# Patient Record
Sex: Male | Born: 1975
Health system: Southern US, Community
[De-identification: ages and names within clinical notes are randomized; demographics above are authoritative.]

## PROBLEM LIST (undated history)

## (undated) DIAGNOSIS — K219 Gastro-esophageal reflux disease without esophagitis: Secondary | ICD-10-CM

## (undated) DIAGNOSIS — G43909 Migraine, unspecified, not intractable, without status migrainosus: Secondary | ICD-10-CM

## (undated) DIAGNOSIS — U071 COVID-19: Secondary | ICD-10-CM

## (undated) DIAGNOSIS — F419 Anxiety disorder, unspecified: Secondary | ICD-10-CM

## (undated) HISTORY — PX: OTHER SURGICAL HISTORY: SHX169

## (undated) HISTORY — DX: Migraine, unspecified, not intractable, without status migrainosus: G43.909

## (undated) HISTORY — DX: Gastro-esophageal reflux disease without esophagitis: K21.9

## (undated) HISTORY — DX: COVID-19: U07.1

## (undated) HISTORY — PX: WISDOM TOOTH EXTRACTION: SHX21

## (undated) HISTORY — DX: Anxiety disorder, unspecified: F41.9

---

## 2006-04-08 HISTORY — PX: SIGMOIDOSCOPY: SUR1295

## 2006-06-07 LAB — HM COLONOSCOPY

## 2006-06-25 LAB — HM COLONOSCOPY

## 2016-08-13 ENCOUNTER — Ambulatory Visit (INDEPENDENT_AMBULATORY_CARE_PROVIDER_SITE_OTHER): Payer: 59 | Admitting: Primary Care

## 2016-08-13 ENCOUNTER — Encounter: Payer: Self-pay | Admitting: Primary Care

## 2016-08-13 VITALS — BP 98/78 | HR 82 | Temp 98.3°F | Ht 69.5 in | Wt 182.0 lb

## 2016-08-13 DIAGNOSIS — R1310 Dysphagia, unspecified: Secondary | ICD-10-CM

## 2016-08-13 DIAGNOSIS — G43701 Chronic migraine without aura, not intractable, with status migrainosus: Secondary | ICD-10-CM

## 2016-08-13 DIAGNOSIS — R0789 Other chest pain: Secondary | ICD-10-CM | POA: Diagnosis not present

## 2016-08-13 DIAGNOSIS — K219 Gastro-esophageal reflux disease without esophagitis: Secondary | ICD-10-CM | POA: Diagnosis not present

## 2016-08-13 LAB — COMPREHENSIVE METABOLIC PANEL
ALBUMIN: 4.3 g/dL (ref 3.5–5.2)
ALK PHOS: 77 U/L (ref 39–117)
ALT: 9 U/L (ref 0–53)
AST: 11 U/L (ref 0–37)
BUN: 19 mg/dL (ref 6–23)
CHLORIDE: 104 meq/L (ref 96–112)
CO2: 30 mEq/L (ref 19–32)
Calcium: 9.2 mg/dL (ref 8.4–10.5)
Creatinine, Ser: 1.17 mg/dL (ref 0.40–1.50)
GFR: 72.52 mL/min (ref >60.00)
GLUCOSE: 92 mg/dL (ref 70–99)
POTASSIUM: 4 meq/L (ref 3.5–5.1)
SODIUM: 139 meq/L (ref 135–145)
Total Bilirubin: 0.4 mg/dL (ref 0.2–1.2)
Total Protein: 7.2 g/dL (ref 6.0–8.3)

## 2016-08-13 LAB — TSH: TSH: 2.78 u[IU]/mL (ref 0.35–4.50)

## 2016-08-13 MED ORDER — SUMATRIPTAN SUCCINATE 50 MG PO TABS: ORAL_TABLET | ORAL | 0 refills | Status: DC

## 2016-08-13 MED ORDER — OMEPRAZOLE 40 MG PO CPDR: 40.0000 mg | DELAYED_RELEASE_CAPSULE | Freq: Every day | ORAL | 0 refills | Status: DC

## 2016-08-13 NOTE — Patient Instructions (Addendum)
The ECG of your heart looks perfect.  Start omeprazole 40 mg for acid reflux. Take 1 tablet by mouth once daily. Please notify me if no improvement within two weeks.  Complete lab work prior to leaving today. I will notify you of your results once received.   I sent refills for Imitrex to your pharmacy.  Please update me if no improvement within two weeks.  It was a pleasure to meet you today! Please don't hesitate to call me with any questions. Welcome to [facility]!

## 2016-08-13 NOTE — Assessment & Plan Note (Signed)
Infrequent use of Imitrex as of recent. Refill provided today, continue to use as needed.

## 2016-08-13 NOTE — Assessment & Plan Note (Signed)
Symptoms today suggestive of uncontrolled esophageal reflux. Check TSH and metabolic panel today. ECG unremarkable today. Do not suspect cardiac involvement. Will have him increase the results of 40 mg once daily for the next 2-3 weeks. He will update if no improvement in the next 2 weeks.

## 2016-08-13 NOTE — Progress Notes (Signed)
Subjective:    Patient ID: Ronald Alvarado, male    DOB: 1975-07-12, 41 y.o.   MRN: [id number]  HPI  Ronald Alvarado is a 41 year old male who presents today to establish care and discuss the problems mentioned below. Will obtain old records.  1) GERD: Currently managed on omeprazole 20 mg as needed for esophageal reflux. He will experience esophageal burning, belching, and lower abdominal discomfort. He will take his omeprazole once monthly on average.  2) Migraines: Currently managed on Imitrex 50 mg PRN. His last migraine was four months ago, and are less frequent since leaving [place], [place]. He has had several headaches which have improved after Excedrin Migraine. Prior to his move his migraines were once monthly on average.  3) Painful Swallowing: Three days ago he noticed symptoms of painful swallowing, esophageal tightness, chest tightness, uncomfortable sensation when laying down. He's also noticed pain to his mid upper back and neck. He's taken omeprazole 2 days in a row without much improvement. He also took an "anti-inflammatory" from his father's prescription without improvement. His symptoms are intermittent and are mostly felt with swallowing. He describes his pain as a tightness. He will experience numbness to his left upper extremity with lifting heavy objects, running, etc. He does have a history of chronic back and left shoulder pain.    Review of Systems  Constitutional: Negative for chills and fever.  HENT: Positive for sore throat and trouble swallowing. Negative for congestion.        Painful swallowing  Respiratory: Negative for cough.   Cardiovascular: Positive for chest pain.  Gastrointestinal: Negative for abdominal pain, diarrhea and nausea.  Musculoskeletal: Positive for back pain.  Neurological: Negative for numbness and headaches.       Past Medical History:  Diagnosis Date  . GERD (gastroesophageal reflux disease)   . Migraines      Social History   Social  History  . Marital status: Married    Spouse name: N/A  . Number of children: N/A  . Years of education: N/A   Occupational History  . Not on file.   Social History Main Topics  . Smoking status: Former Smoker    Quit date: 01/06/2009  . Smokeless tobacco: Never Used  . Alcohol use Not on file  . Drug use: Unknown  . Sexual activity: Not on file   Other Topics Concern  . Not on file   Social History Narrative   Married.   Adopted daughter.   Works as Curator.   Enjoys spending time with his daughter, golfing, baseball.       Past Surgical History:  Procedure Laterality Date  . WISDOM TOOTH EXTRACTION      Family History  Problem Relation Age of Onset  . Heart attack Paternal Grandmother   . Heart attack Paternal Aunt     No Known Allergies  No current outpatient prescriptions on file prior to visit.   No current facility-administered medications on file prior to visit.     BP 98/78 (BP Location: Left Arm, Patient Position: Sitting, Cuff Size: Normal)   Pulse 82   Temp 98.3 F (36.8 C) (Oral)   Ht 5' 9.5" (1.765 m)   Wt 182 lb (82.6 kg)   SpO2 97%   BMI 26.49 kg/m    Objective:   Physical Exam  Constitutional: He appears well-nourished.  HENT:  Mouth/Throat: Oropharynx is clear and moist.  Tenderness to anterior neck distal to thyroid on right side. No  obvious masses noted on examination  Neck: Neck supple. No spinous process tenderness and no muscular tenderness present. Normal range of motion present. No thyromegaly present.  Cardiovascular: Normal rate and regular rhythm.   Pulmonary/Chest: Effort normal and breath sounds normal.  Abdominal: There is no tenderness.  Skin: Skin is warm and dry.          Assessment & Plan:

## 2016-08-13 NOTE — Progress Notes (Signed)
Pre visit review using our clinic review tool, if applicable. No additional management support is needed unless otherwise documented below in the visit note. 

## 2016-08-13 NOTE — Assessment & Plan Note (Addendum)
Symptoms today suggestive of uncontrolled esophageal reflux. Check TSH and metabolic panel today. ECG unremarkable today. Do not suspect cardiac involvement. Will have him increase the results of 40 mg once daily for the next 2-3 weeks. He will update if no improvement in the next 2 weeks. Consider imaging if no improvement at that time.

## 2016-08-15 ENCOUNTER — Encounter: Payer: Self-pay | Admitting: *Deleted

## 2016-08-19 ENCOUNTER — Telehealth: Payer: Self-pay | Admitting: Primary Care

## 2016-08-19 NOTE — Telephone Encounter (Signed)
Rec'd from [facility] forward 14 pages to Pleas Koch NP

## 2016-08-23 ENCOUNTER — Telehealth: Payer: Self-pay | Admitting: Primary Care

## 2016-08-23 NOTE — Telephone Encounter (Signed)
Rec'd from [facility] forward 12 pages to Pleas Koch NP

## 2016-08-27 ENCOUNTER — Encounter: Payer: Self-pay | Admitting: Primary Care

## 2016-09-09 ENCOUNTER — Other Ambulatory Visit: Payer: Self-pay | Admitting: Primary Care

## 2016-09-09 DIAGNOSIS — K219 Gastro-esophageal reflux disease without esophagitis: Secondary | ICD-10-CM

## 2016-09-09 DIAGNOSIS — R0789 Other chest pain: Secondary | ICD-10-CM

## 2016-09-09 NOTE — Telephone Encounter (Signed)
Ok to refill? Electronically refill request for omeprazole (PRILOSEC) 40 MG capsule.  Last prescribed and seen on 08/13/2016.

## 2016-09-09 NOTE — Telephone Encounter (Signed)
How are his symptoms of painful swallowing and acid reflux? Any better on omeprazole 40 mg? If so then I'd like to reduce down to 20 mg. Please let me know.

## 2016-09-10 NOTE — Telephone Encounter (Signed)
Noted. Please have him notify us when he's needing a new script and we'll send the Imitrex 100 mg tablets. Please update this in his med list.

## 2016-09-10 NOTE — Telephone Encounter (Signed)
Spoke to pt. HE said he had stopped taking the 40mg  and bought the 20mg  and has been taking them. He does not need the 40mg .  He said he looked at his old imitrex rx. It was for 100mg . He did pick up the 50mg , but would like to have the 100mg .

## 2016-09-12 NOTE — Telephone Encounter (Signed)
Spoken and notified patient of Kate's comments. Patient verbalized understanding.  Current medication list has been updated.

## 2017-01-02 ENCOUNTER — Other Ambulatory Visit: Payer: Self-pay | Admitting: Primary Care

## 2017-01-02 DIAGNOSIS — Z0000 Encounter for general adult medical examination without abnormal findings: Secondary | ICD-10-CM

## 2017-01-09 ENCOUNTER — Other Ambulatory Visit: Payer: 59

## 2017-01-16 ENCOUNTER — Encounter: Payer: 59 | Admitting: Primary Care

## 2017-04-28 ENCOUNTER — Other Ambulatory Visit: Payer: Self-pay | Admitting: Primary Care

## 2017-04-28 DIAGNOSIS — G43701 Chronic migraine without aura, not intractable, with status migrainosus: Secondary | ICD-10-CM

## 2017-04-29 NOTE — Telephone Encounter (Signed)
Please call and clarify dose.  Is he on 50 mg or 100 mg?

## 2017-04-29 NOTE — Telephone Encounter (Signed)
Ok to refill? Electronically refill request for SUMAtriptan (IMITREX) 50 MG tablet  Last prescribed on 08/13/2016. Last seen on 08/13/2016

## 2017-04-30 MED ORDER — SUMATRIPTAN SUCCINATE 100 MG PO TABS
ORAL_TABLET | ORAL | 0 refills | Status: DC
Start: 2017-04-30 — End: 2018-12-31

## 2017-04-30 NOTE — Telephone Encounter (Signed)
Refill for 100 mg tablets sent. Removed 50 mg tablets from med list.

## 2017-04-30 NOTE — Telephone Encounter (Signed)
Patient is taking the 100 mg. Please refill.

## 2017-09-18 ENCOUNTER — Other Ambulatory Visit: Payer: Self-pay | Admitting: Primary Care

## 2017-09-18 DIAGNOSIS — Z0000 Encounter for general adult medical examination without abnormal findings: Secondary | ICD-10-CM

## 2017-09-24 ENCOUNTER — Other Ambulatory Visit: Payer: Self-pay

## 2017-09-29 ENCOUNTER — Encounter: Payer: Self-pay | Admitting: Primary Care

## 2017-11-04 ENCOUNTER — Other Ambulatory Visit: Payer: Self-pay

## 2017-11-06 ENCOUNTER — Other Ambulatory Visit (INDEPENDENT_AMBULATORY_CARE_PROVIDER_SITE_OTHER): Payer: No Typology Code available for payment source

## 2017-11-06 DIAGNOSIS — Z0000 Encounter for general adult medical examination without abnormal findings: Secondary | ICD-10-CM

## 2017-11-06 LAB — COMPREHENSIVE METABOLIC PANEL
ALT: 27 U/L (ref 0–53)
AST: 30 U/L (ref 0–37)
Albumin: 4.4 g/dL (ref 3.5–5.2)
Alkaline Phosphatase: 83 U/L (ref 39–117)
BILIRUBIN TOTAL: 0.4 mg/dL (ref 0.2–1.2)
BUN: 15 mg/dL (ref 6–23)
CO2: 29 mEq/L (ref 19–32)
Calcium: 9.4 mg/dL (ref 8.4–10.5)
Chloride: 103 mEq/L (ref 96–112)
Creatinine, Ser: 1.08 mg/dL (ref 0.40–1.50)
GFR: 79.08 mL/min (ref >60.00)
GLUCOSE: 103 mg/dL — AB (ref 70–99)
Potassium: 4 mEq/L (ref 3.5–5.1)
SODIUM: 139 meq/L (ref 135–145)
Total Protein: 7.7 g/dL (ref 6.0–8.3)

## 2017-11-06 LAB — LIPID PANEL
CHOL/HDL RATIO: 4
Cholesterol: 211 mg/dL — ABNORMAL HIGH (ref 0–200)
HDL: 49.2 mg/dL (ref >39.00)
NONHDL: 161.91
TRIGLYCERIDES: 225 mg/dL — AB (ref 0.0–149.0)
VLDL: 45 mg/dL — ABNORMAL HIGH (ref 0.0–40.0)

## 2017-11-06 LAB — LDL CHOLESTEROL, DIRECT: LDL DIRECT: 129 mg/dL

## 2017-11-11 ENCOUNTER — Encounter: Payer: Self-pay | Admitting: Primary Care

## 2017-11-11 ENCOUNTER — Ambulatory Visit (INDEPENDENT_AMBULATORY_CARE_PROVIDER_SITE_OTHER): Payer: No Typology Code available for payment source | Admitting: Primary Care

## 2017-11-11 ENCOUNTER — Ambulatory Visit (INDEPENDENT_AMBULATORY_CARE_PROVIDER_SITE_OTHER)
Admission: RE | Admit: 2017-11-11 | Discharge: 2017-11-11 | Disposition: A | Discharge status: Home / Self Care | Payer: No Typology Code available for payment source | Source: Ambulatory Visit | Attending: Primary Care | Admitting: Primary Care

## 2017-11-11 VITALS — BP 116/76 | HR 76 | Temp 97.8°F | Ht 69.5 in | Wt 194.0 lb

## 2017-11-11 DIAGNOSIS — N529 Male erectile dysfunction, unspecified: Secondary | ICD-10-CM

## 2017-11-11 DIAGNOSIS — R739 Hyperglycemia, unspecified: Secondary | ICD-10-CM

## 2017-11-11 DIAGNOSIS — Z23 Encounter for immunization: Secondary | ICD-10-CM | POA: Diagnosis not present

## 2017-11-11 DIAGNOSIS — K219 Gastro-esophageal reflux disease without esophagitis: Secondary | ICD-10-CM | POA: Diagnosis not present

## 2017-11-11 DIAGNOSIS — E785 Hyperlipidemia, unspecified: Secondary | ICD-10-CM

## 2017-11-11 DIAGNOSIS — Z0000 Encounter for general adult medical examination without abnormal findings: Secondary | ICD-10-CM | POA: Diagnosis not present

## 2017-11-11 DIAGNOSIS — G8929 Other chronic pain: Secondary | ICD-10-CM

## 2017-11-11 DIAGNOSIS — M542 Cervicalgia: Secondary | ICD-10-CM

## 2017-11-11 DIAGNOSIS — G43701 Chronic migraine without aura, not intractable, with status migrainosus: Secondary | ICD-10-CM

## 2017-11-11 LAB — POCT GLYCOSYLATED HEMOGLOBIN (HGB A1C): Hemoglobin A1C: 4.8 % (ref 4.0–5.6)

## 2017-11-11 NOTE — Assessment & Plan Note (Addendum)
Tdap due, provided today. Recommended regular exercise and healthy diet.  Exam overall unremarkable. Labs reviewed.  Check POC A1C today given hyperglycemia. Follow up in 1 year for CPE.

## 2017-11-11 NOTE — Progress Notes (Signed)
Subjective:    Patient ID: Ronald Alvarado, male    DOB: May 22, 1975, 42 y.o.   MRN: [id number]  HPI  Ronald Alvarado is a 42 year old male who presents today for complete physical.  Immunizations: -Tetanus: Unsure.  -Influenza: He did not complete last season   Diet: He endorses a healthy diet. Breakfast: Skips Lunch: Left overs Dinner: Meat, little fried food, salad, vegetables, pasta  Snacks: None Desserts: Once weekly  Beverages: Water, occasional alcohol 1-2 nights weekly, occasional sweet tea  Exercise: He is golfing some, no regular exercise Eye exam: Completed several years ago.  Dental exam: Completes annually   Review of Systems  Constitutional: Negative for unexpected weight change.  HENT: Negative for rhinorrhea.   Respiratory: Negative for cough and shortness of breath.   Cardiovascular: Negative for chest pain.  Gastrointestinal: Negative for constipation and diarrhea.  Genitourinary: Negative for difficulty urinating.       Difficulty obtaining an erection, not occurring each time during intercourse, occurs 4-5 times over the last 6 months.   Musculoskeletal: Positive for neck pain.       Lower neck pain with radiation to left shoulder blade. Increased pain with neck extension.  Felt popping sensation years ago when playing basketball. Denies numbness/tingling. Using CBD cream with improvement. Has been getting massages with some improvement, massage therapist believes it's MSK.  Skin: Negative for rash.  Allergic/Immunologic: Negative for environmental allergies.  Neurological: Negative for dizziness and numbness.       Intermittent migraines   BP Readings from Last 3 Encounters:  11/11/17 116/76  08/13/16 98/78        Past Medical History:  Diagnosis Date  . GERD (gastroesophageal reflux disease)   . Migraines      Social History   Socioeconomic History  . Marital status: Married    Spouse name: Not on file  . Number of children: Not on file  .  Years of education: Not on file  . Highest education level: Not on file  Occupational History  . Not on file  Social Needs  . Financial resource strain: Not on file  . Food insecurity:    Worry: Not on file    Inability: Not on file  . Transportation needs:    Medical: Not on file    Non-medical: Not on file  Tobacco Use  . Smoking status: Former Smoker    Last attempt to quit: 01/06/2009    Years since quitting: 8.8  . Smokeless tobacco: Never Used  Substance and Sexual Activity  . Alcohol use: Not on file  . Drug use: Not on file  . Sexual activity: Not on file  Lifestyle  . Physical activity:    Days per week: Not on file    Minutes per session: Not on file  . Stress: Not on file  Relationships  . Social connections:    Talks on phone: Not on file    Gets together: Not on file    Attends religious service: Not on file    Active member of club or organization: Not on file    Attends meetings of clubs or organizations: Not on file    Relationship status: Not on file  . Intimate partner violence:    Fear of current or ex partner: Not on file    Emotionally abused: Not on file    Physically abused: Not on file    Forced sexual activity: Not on file  Other Topics Concern  .  Not on file  Social History Narrative   Married.   Adopted daughter.   Works as Curator.   Enjoys spending time with his daughter, golfing, baseball.      Family History  Problem Relation Age of Onset  . Heart attack Paternal Grandmother   . Heart attack Paternal Aunt     No Known Allergies  Current Outpatient Medications on File Prior to Visit  Medication Sig Dispense Refill  . Multiple Vitamin (MULTIVITAMIN) tablet Take 1 tablet by mouth daily.    Marland Kitchen omeprazole (PRILOSEC) 40 MG capsule Take 1 capsule (40 mg total) by mouth daily. 30 capsule 0  . SUMAtriptan (IMITREX) 100 MG tablet Take 1 tablet by mouth at migraine onset. May repeat in 2 hours if headache persists or recurs. Do  not exceed 2 tablets in 24 hours. 10 tablet 0   No current facility-administered medications on file prior to visit.     BP 116/76   Pulse 76   Temp 97.8 F (36.6 C) (Oral)   Ht 5' 9.5" (1.765 m)   Wt 194 lb (88 kg)   SpO2 98%   BMI 28.24 kg/m    Objective:   Physical Exam  Constitutional: He is oriented to person, place, and time. He appears well-nourished.  HENT:  Mouth/Throat: No oropharyngeal exudate.  Eyes: Pupils are equal, round, and reactive to light. EOM are normal.  Neck: Neck supple. No thyromegaly present.  Cardiovascular: Normal rate and regular rhythm.  Respiratory: Effort normal and breath sounds normal.  GI: Soft. Bowel sounds are normal. There is no tenderness.  Musculoskeletal: Normal range of motion.       Cervical back: He exhibits normal range of motion, no tenderness and no bony tenderness.       Back:  Neurological: He is alert and oriented to person, place, and time.  Skin: Skin is warm and dry.  Psychiatric: He has a normal mood and affect.           Assessment & Plan:

## 2017-11-11 NOTE — Assessment & Plan Note (Signed)
Chronic for years, gradually more bothersome. Agree that this may be MSK but will rule out any cervical involvement. Check plain films today. Consider physical therapy.

## 2017-11-11 NOTE — Assessment & Plan Note (Signed)
Infrequent, using Imitrex every 2 months. Overall improved.

## 2017-11-11 NOTE — Assessment & Plan Note (Signed)
Infrequent use of Prilosec. Using chewable natural substance from [employer].

## 2017-11-11 NOTE — Assessment & Plan Note (Signed)
Difficulty obtaining erection. Unclear etiology. Discussed the need for regular exercise and healthy diet. He will work on those things and update. Consider testosterone check.

## 2017-11-11 NOTE — Assessment & Plan Note (Signed)
TC and Trigs above goal, LDL borderline. Discussed to work on diet and exercise. Repeat in 1 year.

## 2017-11-11 NOTE — Patient Instructions (Signed)
Stop by the lab and xray prior to leaving today. I will notify you of your results once received.   You were provided with a tetanus vaccination which will cover you for 10 years.   Start exercising. You should be getting 150 minutes of moderate intensity exercise weekly.  Make sure to eat a healthy diet with plenty of vegetables, fruit, whole grains, lean protein.  Ensure you are consuming 64 ounces of water daily.  Follow up in 1 year for your annual exam or sooner if needed.  It was a pleasure to see you today!

## 2017-11-13 DIAGNOSIS — G8929 Other chronic pain: Secondary | ICD-10-CM

## 2017-11-13 DIAGNOSIS — M542 Cervicalgia: Principal | ICD-10-CM

## 2017-11-14 NOTE — Telephone Encounter (Signed)
Marion/Anastasiya. FYI, I put in a referral, he just needs this processed before August 13th which is his appointment date. Thanks!

## 2017-12-23 ENCOUNTER — Ambulatory Visit (INDEPENDENT_AMBULATORY_CARE_PROVIDER_SITE_OTHER): Payer: No Typology Code available for payment source | Admitting: Primary Care

## 2017-12-23 ENCOUNTER — Encounter: Payer: Self-pay | Admitting: Primary Care

## 2017-12-23 VITALS — BP 118/76 | HR 71 | Temp 98.1°F | Ht 69.5 in | Wt 192.8 lb

## 2017-12-23 DIAGNOSIS — G8929 Other chronic pain: Secondary | ICD-10-CM | POA: Diagnosis not present

## 2017-12-23 DIAGNOSIS — M542 Cervicalgia: Secondary | ICD-10-CM | POA: Diagnosis not present

## 2017-12-23 NOTE — Progress Notes (Signed)
Subjective:    Patient ID: Ronald Alvarado, male    DOB: 04-17-1975, 42 y.o.   MRN: [id number]  HPI  Mr. Ronald Alvarado is a 42 year old male who presents today for follow up of chronic neck pain.  He was last evaluated in August for CPE when he mentioned a chronic history of neck pain that had gradually become more bothersome. Plain films of the cervical spine were completed which showed minimal disc space narrowing to C5-6. He was referred to physical therapy for further treatment.  Since his last visit he's completed 5 weeks of physical therapy with improvement in muscle tightness around the neck and upper back. He hasn't noticed any improvement to the chronic neck pain for which he brought up during his visit last month. He's underwent a massage this morning and was told that there was improvement in the tightness surrounding his neck.   He continues to experience pain with neck extension, right and left neck flexion. He has noticed tingling to his hands, mostly left, when running. He hasn't been running recently.   Review of Systems  Musculoskeletal: Positive for arthralgias, myalgias and neck pain.  Skin: Negative for color change.  Neurological: Positive for numbness. Negative for weakness.       Past Medical History:  Diagnosis Date  . GERD (gastroesophageal reflux disease)   . Migraines      Social History   Socioeconomic History  . Marital status: Married    Spouse name: Not on file  . Number of children: Not on file  . Years of education: Not on file  . Highest education level: Not on file  Occupational History  . Not on file  Social Needs  . Financial resource strain: Not on file  . Food insecurity:    Worry: Not on file    Inability: Not on file  . Transportation needs:    Medical: Not on file    Non-medical: Not on file  Tobacco Use  . Smoking status: Former Smoker    Last attempt to quit: 01/06/2009    Years since quitting: 8.9  . Smokeless tobacco: Never Used    Substance and Sexual Activity  . Alcohol use: Not on file  . Drug use: Not on file  . Sexual activity: Not on file  Lifestyle  . Physical activity:    Days per week: Not on file    Minutes per session: Not on file  . Stress: Not on file  Relationships  . Social connections:    Talks on phone: Not on file    Gets together: Not on file    Attends religious service: Not on file    Active member of club or organization: Not on file    Attends meetings of clubs or organizations: Not on file    Relationship status: Not on file  . Intimate partner violence:    Fear of current or ex partner: Not on file    Emotionally abused: Not on file    Physically abused: Not on file    Forced sexual activity: Not on file  Other Topics Concern  . Not on file  Social History Narrative   Married.   Adopted daughter.   Works as Curator.   Enjoys spending time with his daughter, golfing, baseball.    Past Surgical History:  Procedure Laterality Date  . WISDOM TOOTH EXTRACTION      Family History  Problem Relation Age of Onset  . Heart attack  Paternal Grandmother   . Heart attack Paternal Aunt     No Known Allergies  Current Outpatient Medications on File Prior to Visit  Medication Sig Dispense Refill  . Multiple Vitamin (MULTIVITAMIN) tablet Take 1 tablet by mouth daily.    . SUMAtriptan (IMITREX) 100 MG tablet Take 1 tablet by mouth at migraine onset. May repeat in 2 hours if headache persists or recurs. Do not exceed 2 tablets in 24 hours. 10 tablet 0   No current facility-administered medications on file prior to visit.     BP 118/76   Pulse 71   Temp 98.1 F (36.7 C) (Oral)   Ht 5' 9.5" (1.765 m)   Wt 192 lb 12 oz (87.4 kg)   SpO2 98%   BMI 28.06 kg/m    Objective:   Physical Exam  Constitutional: He appears well-nourished.  Neck: Neck supple. No spinous process tenderness and no muscular tenderness present. Decreased range of motion present.  Pain with neck  posterior, right and left extension, right and left forward flexion.   Cardiovascular: Normal rate.  Respiratory: Effort normal.  Skin: Skin is warm and dry.           Assessment & Plan:

## 2017-12-23 NOTE — Assessment & Plan Note (Signed)
Little to no improvement to original problem with physical therapy. Plain films suggestive of mild disc disease to C5-6. Given persistent symptoms despite conservative treatment, will send to orthopedics for further evaluation.

## 2017-12-23 NOTE — Patient Instructions (Signed)
Stop by the front desk and speak with [facility] regarding your referral to orthopedics.  Continue with physical therapy this week as discussed.  It was a pleasure to see you today!

## 2018-01-09 ENCOUNTER — Other Ambulatory Visit: Payer: Self-pay | Admitting: Orthopedic Surgery

## 2018-01-09 DIAGNOSIS — M5412 Radiculopathy, cervical region: Secondary | ICD-10-CM

## 2018-01-15 ENCOUNTER — Ambulatory Visit
Admission: RE | Admit: 2018-01-15 | Discharge: 2018-01-15 | Disposition: A | Discharge status: Home / Self Care | Payer: No Typology Code available for payment source | Source: Ambulatory Visit | Attending: Orthopedic Surgery | Admitting: Orthopedic Surgery

## 2018-01-15 DIAGNOSIS — M5412 Radiculopathy, cervical region: Secondary | ICD-10-CM

## 2018-02-20 MED FILL — MELOXICAM 15 MG TABLET: 15 | 30 days supply | Qty: 30 | Fill #0

## 2018-03-18 ENCOUNTER — Telehealth: Payer: Self-pay

## 2018-03-18 NOTE — Telephone Encounter (Signed)
I spoke with pt and he is not having CP , now rt upper abd pain is minimal; pain level 1-2 which come and goes; worse when sitting and the absolute worse when driving(pain level 5 when drives). The reason pt is sure he does not have stones in GB is pts wife is [place] tech and did [place] and no stones showed.pt is in no distress now and agrees if condition worsens prior to appt pt will go to ED prior to appt. Pt has appt with Gentry Fitz NP on 03/19/18 at 9:20.

## 2018-03-19 ENCOUNTER — Ambulatory Visit (INDEPENDENT_AMBULATORY_CARE_PROVIDER_SITE_OTHER)
Admission: RE | Admit: 2018-03-19 | Discharge: 2018-03-19 | Disposition: A | Discharge status: Home / Self Care | Payer: No Typology Code available for payment source | Source: Ambulatory Visit | Attending: Primary Care | Admitting: Primary Care

## 2018-03-19 ENCOUNTER — Encounter: Payer: Self-pay | Admitting: *Deleted

## 2018-03-19 ENCOUNTER — Ambulatory Visit (INDEPENDENT_AMBULATORY_CARE_PROVIDER_SITE_OTHER): Payer: No Typology Code available for payment source | Admitting: Primary Care

## 2018-03-19 ENCOUNTER — Encounter: Payer: Self-pay | Admitting: Primary Care

## 2018-03-19 VITALS — BP 116/80 | HR 76 | Temp 98.2°F | Ht 69.5 in | Wt 194.5 lb

## 2018-03-19 DIAGNOSIS — R1011 Right upper quadrant pain: Secondary | ICD-10-CM | POA: Diagnosis not present

## 2018-03-19 LAB — CBC WITH DIFFERENTIAL/PLATELET
Basophils Absolute: 0 10*3/uL (ref 0.0–0.1)
Basophils Relative: 0.5 % (ref 0.0–3.0)
Eosinophils Absolute: 0.1 10*3/uL (ref 0.0–0.7)
Eosinophils Relative: 3.4 % (ref 0.0–5.0)
HCT: 43.2 % (ref 39.0–52.0)
Hemoglobin: 14.7 g/dL (ref 13.0–17.0)
Lymphocytes Relative: 31.6 % (ref 12.0–46.0)
Lymphs Abs: 1.4 10*3/uL (ref 0.7–4.0)
MCHC: 34 g/dL (ref 30.0–36.0)
MCV: 89.2 fl (ref 78.0–100.0)
Monocytes Absolute: 0.4 10*3/uL (ref 0.1–1.0)
Monocytes Relative: 9 % (ref 3.0–12.0)
Neutro Abs: 2.4 10*3/uL (ref 1.4–7.7)
Neutrophils Relative %: 55.5 % (ref 43.0–77.0)
Platelets: 176 10*3/uL (ref 150.0–400.0)
RBC: 4.85 Mil/uL (ref 4.22–5.81)
RDW: 12.9 % (ref 11.5–15.5)
WBC: 4.4 10*3/uL (ref 4.0–10.5)

## 2018-03-19 LAB — COMPREHENSIVE METABOLIC PANEL
ALT: 17 U/L (ref 0–53)
AST: 18 U/L (ref 0–37)
Albumin: 4.4 g/dL (ref 3.5–5.2)
Alkaline Phosphatase: 71 U/L (ref 39–117)
BUN: 14 mg/dL (ref 6–23)
CO2: 30 mEq/L (ref 19–32)
Calcium: 9.4 mg/dL (ref 8.4–10.5)
Chloride: 105 mEq/L (ref 96–112)
Creatinine, Ser: 1.14 mg/dL (ref 0.40–1.50)
GFR: 74.17 mL/min (ref >60.00)
Glucose, Bld: 100 mg/dL — ABNORMAL HIGH (ref 70–99)
POTASSIUM: 4.2 meq/L (ref 3.5–5.1)
Sodium: 139 mEq/L (ref 135–145)
Total Bilirubin: 0.4 mg/dL (ref 0.2–1.2)
Total Protein: 7.4 g/dL (ref 6.0–8.3)

## 2018-03-19 LAB — LIPASE: Lipase: 11 U/L (ref 11.0–59.0)

## 2018-03-19 LAB — H. PYLORI ANTIBODY, IGG: H Pylori IgG: NEGATIVE

## 2018-03-19 NOTE — Telephone Encounter (Signed)
Patient evaluated today

## 2018-03-19 NOTE — Assessment & Plan Note (Signed)
Present x 1 month. HPI sounds similar to gall bladder involvement but other differentials include constipation, GERD, MSK involvement. Check abdominal plain films today. Also check labs including CBC, H pylori, CMP, Lipase. Also consider RUQ abdominal ultrasound once labs return.

## 2018-03-19 NOTE — Progress Notes (Signed)
Subjective:    Patient ID: Ronald Alvarado, male    DOB: 1976/04/03, 42 y.o.   MRN: [id number]  HPI  Mr. Mostafa is a 42 year old male with a history of GERD who presents today with a chief complaint of abdominal pain.  His pain is located to the right upper quadrant for which he noticed one month ago which is daily and intermittent throughout the day. He experienced a similar pain 10 years ago when driving which went away as soon as he got out of the car. He describes his current pain as burning, sharp, uncomfortable. He will notice his pain when driving longer than 10 minutes or sitting at work for longer than 2 hours. Improves with standing up or stretching.   He will notice pain after eating large meals, has not been able to identify certain foods that cause it to become worse. He doesn't notice pain with smaller meals. He denies nausea, vomiting, diarrhea. He does have intermittent gas and constipation which is chronic. He does experience infrequent GERD with symptoms of substernal burning and belching, this occurs twice monthly on average and resolves after taking omeprazole.   His wife is an Restaurant manager, fast food who took scanned his entire abdomen and found 2 small polyps in the gallbladder, otherwise unremarkable. He's not taken anything OTC for his symptoms.   Review of Systems  Constitutional: Negative for unexpected weight change.  Respiratory: Negative for shortness of breath.   Gastrointestinal: Positive for abdominal pain. Negative for blood in stool, diarrhea, nausea and vomiting.       Past Medical History:  Diagnosis Date  . GERD (gastroesophageal reflux disease)   . Migraines      Social History   Socioeconomic History  . Marital status: Married    Spouse name: Not on file  . Number of children: Not on file  . Years of education: Not on file  . Highest education level: Not on file  Occupational History  . Not on file  Social Needs  . Financial resource strain:  Not on file  . Food insecurity:    Worry: Not on file    Inability: Not on file  . Transportation needs:    Medical: Not on file    Non-medical: Not on file  Tobacco Use  . Smoking status: Former Smoker    Last attempt to quit: 01/06/2009    Years since quitting: 9.2  . Smokeless tobacco: Never Used  Substance and Sexual Activity  . Alcohol use: Not on file  . Drug use: Not on file  . Sexual activity: Not on file  Lifestyle  . Physical activity:    Days per week: Not on file    Minutes per session: Not on file  . Stress: Not on file  Relationships  . Social connections:    Talks on phone: Not on file    Gets together: Not on file    Attends religious service: Not on file    Active member of club or organization: Not on file    Attends meetings of clubs or organizations: Not on file    Relationship status: Not on file  . Intimate partner violence:    Fear of current or ex partner: Not on file    Emotionally abused: Not on file    Physically abused: Not on file    Forced sexual activity: Not on file  Other Topics Concern  . Not on file  Social History Narrative   Married.  Adopted daughter.   Works as Curator.   Enjoys spending time with his daughter, golfing, baseball.    Past Surgical History:  Procedure Laterality Date  . WISDOM TOOTH EXTRACTION      Family History  Problem Relation Age of Onset  . Heart attack Paternal Grandmother   . Heart attack Paternal Aunt     No Known Allergies  Current Outpatient Medications on File Prior to Visit  Medication Sig Dispense Refill  . meloxicam (MOBIC) 15 MG tablet Take 15 mg by mouth daily.    . methocarbamol (ROBAXIN) 500 MG tablet Take 500 mg by mouth every morning.    . Multiple Vitamin (MULTIVITAMIN) tablet Take 1 tablet by mouth daily.    . SUMAtriptan (IMITREX) 100 MG tablet Take 1 tablet by mouth at migraine onset. May repeat in 2 hours if headache persists or recurs. Do not exceed 2 tablets in 24  hours. 10 tablet 0  . cyclobenzaprine (FLEXERIL) 5 MG tablet Take 5 mg by mouth at bedtime.     No current facility-administered medications on file prior to visit.     BP 116/80   Pulse 76   Temp 98.2 F (36.8 C) (Oral)   Ht 5' 9.5" (1.765 m)   Wt 194 lb 8 oz (88.2 kg)   SpO2 97%   BMI 28.31 kg/m    Objective:   Physical Exam  Constitutional: He appears well-nourished.  Neck: Neck supple.  Cardiovascular: Normal rate and regular rhythm.  Respiratory: Effort normal and breath sounds normal.  GI: Soft. Bowel sounds are normal. There is no abdominal tenderness.    No tenderness on exam. Pain located to right upper quadrant as shown in picture.  Skin: Skin is warm and dry.           Assessment & Plan:

## 2018-03-19 NOTE — Patient Instructions (Signed)
Complete xray(s) and lab prior to leaving today. I will notify you of your results once received.  I'll be in touch soon. It was a pleasure to see you today!

## 2018-03-20 DIAGNOSIS — R1011 Right upper quadrant pain: Secondary | ICD-10-CM

## 2018-04-09 ENCOUNTER — Ambulatory Visit (HOSPITAL_COMMUNITY): Payer: No Typology Code available for payment source

## 2018-04-21 ENCOUNTER — Ambulatory Visit (HOSPITAL_COMMUNITY)
Admission: RE | Admit: 2018-04-21 | Discharge: 2018-04-21 | Disposition: A | Discharge status: Home / Self Care | Payer: No Typology Code available for payment source | Source: Ambulatory Visit | Attending: Primary Care | Admitting: Primary Care

## 2018-04-21 DIAGNOSIS — R1011 Right upper quadrant pain: Secondary | ICD-10-CM

## 2018-05-21 ENCOUNTER — Telehealth: Payer: Self-pay | Admitting: Primary Care

## 2018-05-21 DIAGNOSIS — M542 Cervicalgia: Principal | ICD-10-CM

## 2018-05-21 DIAGNOSIS — G8929 Other chronic pain: Secondary | ICD-10-CM

## 2018-05-21 NOTE — Telephone Encounter (Signed)
That sounds fine.  How often is he taking the Meloxicam? Is he taking this for his neck? What's the plan from ortho regarding his neck?

## 2018-05-21 NOTE — Telephone Encounter (Signed)
Will communicate with patient via mychart.

## 2018-05-21 NOTE — Telephone Encounter (Signed)
Spoken to patient. He stated that he takes it 4-5 times a week and only once a day.  Yes, he is taking this for his neck. He would like a refill but also ask about diclofenac gel/cream. He ask if this is better for his neck pain. Please advise.  Ortho stated that patient can get a steroid  injection into the neck which can help up to 2 months or so. The last option is surgery which patient does not want to at this time.

## 2018-05-21 NOTE — Telephone Encounter (Signed)
Pt called office in regards to getting his meloxicam refilled. He was told by the orthopaedist he was referred to by Anda Kraft to reach out to his PCP. Pt wants to know if Anda Kraft can refill the medication. Pt also will be using [facility] due to his insurance. Pt is requesting a call back.

## 2018-05-22 MED ORDER — MELOXICAM 15 MG PO TABS: 15.0000 mg | ORAL_TABLET | Freq: Every day | ORAL | 0 refills | Status: DC

## 2018-05-22 MED FILL — MELOXICAM 15 MG TABLET: 15 | 90 days supply | Qty: 90 | Fill #0

## 2018-05-22 NOTE — Addendum Note (Signed)
Addended by: Pleas Koch on: 05/22/2018 08:24 AM   Modules accepted: Orders

## 2018-12-26 ENCOUNTER — Other Ambulatory Visit: Payer: Self-pay | Admitting: Primary Care

## 2018-12-26 DIAGNOSIS — G43701 Chronic migraine without aura, not intractable, with status migrainosus: Secondary | ICD-10-CM

## 2018-12-28 NOTE — Telephone Encounter (Signed)
Please notify patient that he is overdue for CPE. He will need this before we can refill any other medications. Please notify me when he's scheduled and then I'll refill his sumatriptan.

## 2018-12-28 NOTE — Telephone Encounter (Signed)
Last filled 1/82019... last OV 03/2018 acute visit, no upcoming appts... please advise  ---forward to [facility]

## 2018-12-30 NOTE — Telephone Encounter (Signed)
Pt is scheduled for same day labs and cpe on 01/25/19.

## 2018-12-30 NOTE — Telephone Encounter (Signed)
Message left for patient to return my call.  

## 2018-12-31 MED ORDER — SUMATRIPTAN SUCCINATE 100 MG PO TABS: ORAL_TABLET | ORAL | 1 refills | Status: DC

## 2018-12-31 NOTE — Telephone Encounter (Signed)
[place] Night - Client TELEPHONE ADVICE RECORD [facility] Patient Name: Ronald Alvarado Gender: Male DOB: [birth year] Age: 43 Y 9 M 8 D Return Phone Number: [phone] (Secondary) Address: City/State/Zip: [place] [address] Client [place] Night - Client Client Site [place] Physician Alma Friendly - NP Contact Type Call Who Is Calling Patient / Member / Family / Caregiver Call Type Triage / Clinical Relationship To Patient Self Return Phone Number [phone] (Secondary) Chief Complaint Prescription Refill or Medication Request (non symptomatic) Reason for Call Symptomatic / Request for [place] states pt has message about needing to book physical prior refill rx but now that it has been booked pt will need refill before going out of town for rx sumatriptan. Translation No Nurse Assessment Nurse: Laurann Montana, RN, Fransico Meadow Date/Time Eilene Ghazi Time): 12/30/2018 6:29:14 PM Please select the assessment type ---Refill Additional Documentation ---Caller states that he had to schedule an apt before he could rcv rx for sumatriptan. Caller states that he needs medication now. Caller states that he is leaving out of town tomorrow and needs it refilled before leaving. RN explained that medications were not called in after hours. Patient states he will be talking to MD tomorrow morning because he doesn't even know why he needed to have a physical for the refill in the first place. Patient keeps stating he has been on medication for eight years. Does the patient have enough medication to last until the office opens? ---No Guidelines Guideline Title Affirmed Question Affirmed Notes Nurse Date/Time (Eastern Time) Disp. Time Eilene Ghazi Time) Disposition Final User 12/30/2018 6:25:44 PM Send To RN Personal Junius Creamer, RN, Debra 12/30/2018 6:32:02 PM Clinical Call Yes  Laurann Montana, RN, Fransico Meadow

## 2018-12-31 NOTE — Telephone Encounter (Signed)
Refill as requested. Patient have been notified.

## 2018-12-31 NOTE — Addendum Note (Signed)
Addended by: Jacqualin Combes on: 12/31/2018 09:24 AM   Modules accepted: Orders

## 2019-01-25 ENCOUNTER — Other Ambulatory Visit: Payer: Self-pay

## 2019-01-25 ENCOUNTER — Ambulatory Visit (INDEPENDENT_AMBULATORY_CARE_PROVIDER_SITE_OTHER): Payer: No Typology Code available for payment source | Admitting: Primary Care

## 2019-01-25 ENCOUNTER — Encounter: Payer: Self-pay | Admitting: Primary Care

## 2019-01-25 VITALS — BP 108/70 | HR 77 | Temp 97.1°F | Ht 69.5 in | Wt 194.2 lb

## 2019-01-25 DIAGNOSIS — K219 Gastro-esophageal reflux disease without esophagitis: Secondary | ICD-10-CM

## 2019-01-25 DIAGNOSIS — G43701 Chronic migraine without aura, not intractable, with status migrainosus: Secondary | ICD-10-CM | POA: Diagnosis not present

## 2019-01-25 DIAGNOSIS — Z0000 Encounter for general adult medical examination without abnormal findings: Secondary | ICD-10-CM

## 2019-01-25 DIAGNOSIS — D229 Melanocytic nevi, unspecified: Secondary | ICD-10-CM

## 2019-01-25 DIAGNOSIS — M542 Cervicalgia: Secondary | ICD-10-CM | POA: Diagnosis not present

## 2019-01-25 DIAGNOSIS — G8929 Other chronic pain: Secondary | ICD-10-CM

## 2019-01-25 LAB — COMPREHENSIVE METABOLIC PANEL
ALT: 12 U/L (ref 0–53)
AST: 14 U/L (ref 0–37)
Albumin: 4.3 g/dL (ref 3.5–5.2)
Alkaline Phosphatase: 74 U/L (ref 39–117)
BUN: 19 mg/dL (ref 6–23)
CO2: 27 mEq/L (ref 19–32)
Calcium: 9.5 mg/dL (ref 8.4–10.5)
Chloride: 103 mEq/L (ref 96–112)
Creatinine, Ser: 1.14 mg/dL (ref 0.40–1.50)
GFR: 69.51 mL/min (ref >60.00)
Glucose, Bld: 102 mg/dL — ABNORMAL HIGH (ref 70–99)
Potassium: 4.3 mEq/L (ref 3.5–5.1)
Sodium: 137 mEq/L (ref 135–145)
Total Bilirubin: 0.6 mg/dL (ref 0.2–1.2)
Total Protein: 7.3 g/dL (ref 6.0–8.3)

## 2019-01-25 LAB — LIPID PANEL
Cholesterol: 171 mg/dL (ref 0–200)
HDL: 44.8 mg/dL (ref >39.00)
LDL Cholesterol: 108 mg/dL — ABNORMAL HIGH (ref 0–99)
NonHDL: 126.29
Total CHOL/HDL Ratio: 4
Triglycerides: 91 mg/dL (ref 0.0–149.0)
VLDL: 18.2 mg/dL (ref 0.0–40.0)

## 2019-01-25 MED ORDER — GABAPENTIN 100 MG PO CAPS: ORAL_CAPSULE | ORAL | 0 refills | Status: DC

## 2019-01-25 NOTE — Assessment & Plan Note (Signed)
Tetanus UTD, declines influenza vaccination. Encouraged to increase exercise, continue to work on a healthy diet.  Exam today unremarkable. Labs pending. Follow up in 1 year for CPE.

## 2019-01-25 NOTE — Patient Instructions (Signed)
You will be contacted regarding your referral to dermatology.  Please let us know if you have not been contacted within one week.   Continue exercising. You should be getting 150 minutes of moderate intensity exercise weekly.  Continue to eat a healthy diet. Ensure you are consuming 64 ounces of water daily.  You may take 1-2 capsules of the gabapentin at bedtime for neck pain.  Stop by the lab prior to leaving today. I will notify you of your results once received.   It was a pleasure to see you today!   Preventive Care 31-70 Years Old, Male Preventive care refers to lifestyle choices and visits with your health care provider that can promote health and wellness. This includes:  A yearly physical exam. This is also called an annual well check.  Regular dental and eye exams.  Immunizations.  Screening for certain conditions.  Healthy lifestyle choices, such as eating a healthy diet, getting regular exercise, not using drugs or products that contain nicotine and tobacco, and limiting alcohol use. What can I expect for my preventive care visit? Physical exam Your health care provider will check:  Height and weight. These may be used to calculate body mass index (BMI), which is a measurement that tells if you are at a healthy weight.  Heart rate and blood pressure.  Your skin for abnormal spots. Counseling Your health care provider may ask you questions about:  Alcohol, tobacco, and drug use.  Emotional well-being.  Home and relationship well-being.  Sexual activity.  Eating habits.  Work and work Statistician. What immunizations do I need?  Influenza (flu) vaccine  This is recommended every year. Tetanus, diphtheria, and pertussis (Tdap) vaccine  You may need a Td booster every 10 years. Varicella (chickenpox) vaccine  You may need this vaccine if you have not already been vaccinated. Zoster (shingles) vaccine  You may need this after age 72. Measles, mumps,  and rubella (MMR) vaccine  You may need at least one dose of MMR if you were born in 1957 or later. You may also need a second dose. Pneumococcal conjugate (PCV13) vaccine  You may need this if you have certain conditions and were not previously vaccinated. Pneumococcal polysaccharide (PPSV23) vaccine  You may need one or two doses if you smoke cigarettes or if you have certain conditions. Meningococcal conjugate (MenACWY) vaccine  You may need this if you have certain conditions. Hepatitis A vaccine  You may need this if you have certain conditions or if you travel or work in places where you may be exposed to hepatitis A. Hepatitis B vaccine  You may need this if you have certain conditions or if you travel or work in places where you may be exposed to hepatitis B. Haemophilus influenzae type b (Hib) vaccine  You may need this if you have certain risk factors. Human papillomavirus (HPV) vaccine  If recommended by your health care provider, you may need three doses over 6 months. You may receive vaccines as individual doses or as more than one vaccine together in one shot (combination vaccines). Talk with your health care provider about the risks and benefits of combination vaccines. What tests do I need? Blood tests  Lipid and cholesterol levels. These may be checked every 5 years, or more frequently if you are over 37 years old.  Hepatitis C test.  Hepatitis B test. Screening  Lung cancer screening. You may have this screening every year starting at age 67 if you have a 30-pack-year  history of smoking and currently smoke or have quit within the past 15 years.  Prostate cancer screening. Recommendations will vary depending on your family history and other risks.  Colorectal cancer screening. All adults should have this screening starting at age 5 and continuing until age 92. Your health care provider may recommend screening at age 25 if you are at increased risk. You will  have tests every 1-10 years, depending on your results and the type of screening test.  Diabetes screening. This is done by checking your blood sugar (glucose) after you have not eaten for a while (fasting). You may have this done every 1-3 years.  Sexually transmitted disease (STD) testing. Follow these instructions at home: Eating and drinking  Eat a diet that includes fresh fruits and vegetables, whole grains, lean protein, and low-fat dairy products.  Take vitamin and mineral supplements as recommended by your health care provider.  Do not drink alcohol if your health care provider tells you not to drink.  If you drink alcohol: ? Limit how much you have to 0-2 drinks a day. ? Be aware of how much alcohol is in your drink. In the [place] one drink equals one 12 oz bottle of beer (355 mL), one 5 oz glass of wine (148 mL), or one 1 oz glass of hard liquor (44 mL). Lifestyle  Take daily care of your teeth and gums.  Stay active. Exercise for at least 30 minutes on 5 or more days each week.  Do not use any products that contain nicotine or tobacco, such as cigarettes, e-cigarettes, and chewing tobacco. If you need help quitting, ask your health care provider.  If you are sexually active, practice safe sex. Use a condom or other form of protection to prevent STIs (sexually transmitted infections).  Talk with your health care provider about taking a low-dose aspirin every day starting at age 50. What's next?  Go to your health care provider once a year for a well check visit.  Ask your health care provider how often you should have your eyes and teeth checked.  Stay up to date on all vaccines. This information is not intended to replace advice given to you by your health care provider. Make sure you discuss any questions you have with your health care provider. Document Released: 04/21/2015 Document Revised: 03/19/2018 Document Reviewed: 03/19/2018 Elsevier Patient Education  2020  [employer].

## 2019-01-25 NOTE — Progress Notes (Signed)
Subjective:    Patient ID: Ronald Alvarado, male    DOB: 1975-12-10, 43 y.o.   MRN: [id number]  HPI  Ronald Alvarado is a 43 year old male who presents today for complete physical.  Immunizations: -Tetanus: Completed in 2019 -Influenza: Declines   Diet: He endorses a healthy diet.  Exercise: He is working with a Physiological scientist once weekly, active during the week.   Eye exam: No recent exam Dental exam: Completes semi-annually   BP Readings from Last 3 Encounters:  01/25/19 108/70  03/19/18 116/80  12/23/17 118/76     Review of Systems  Constitutional: Negative for unexpected weight change.  HENT: Negative for rhinorrhea.   Respiratory: Negative for cough and shortness of breath.   Cardiovascular: Negative for chest pain.  Gastrointestinal: Negative for diarrhea.       Occasional constipation   Genitourinary: Negative for difficulty urinating.  Musculoskeletal: Positive for arthralgias and neck pain.  Skin: Negative for rash.  Allergic/Immunologic: Negative for environmental allergies.  Neurological: Negative for dizziness and numbness.       Intermittent headaches   Psychiatric/Behavioral:       Intermittent anxiety        Past Medical History:  Diagnosis Date  . GERD (gastroesophageal reflux disease)   . Migraines      Social History   Socioeconomic History  . Marital status: Married    Spouse name: Not on file  . Number of children: Not on file  . Years of education: Not on file  . Highest education level: Not on file  Occupational History  . Not on file  Social Needs  . Financial resource strain: Not on file  . Food insecurity    Worry: Not on file    Inability: Not on file  . Transportation needs    Medical: Not on file    Non-medical: Not on file  Tobacco Use  . Smoking status: Former Smoker    Quit date: 01/06/2009    Years since quitting: 10.0  . Smokeless tobacco: Never Used  Substance and Sexual Activity  . Alcohol use: Not on file  . Drug  use: Not on file  . Sexual activity: Not on file  Lifestyle  . Physical activity    Days per week: Not on file    Minutes per session: Not on file  . Stress: Not on file  Relationships  . Social Herbalist on phone: Not on file    Gets together: Not on file    Attends religious service: Not on file    Active member of club or organization: Not on file    Attends meetings of clubs or organizations: Not on file    Relationship status: Not on file  . Intimate partner violence    Fear of current or ex partner: Not on file    Emotionally abused: Not on file    Physically abused: Not on file    Forced sexual activity: Not on file  Other Topics Concern  . Not on file  Social History Narrative   Married.   Adopted daughter.   Works as Curator.   Enjoys spending time with his daughter, golfing, baseball.    Past Surgical History:  Procedure Laterality Date  . WISDOM TOOTH EXTRACTION      Family History  Problem Relation Age of Onset  . Heart attack Paternal Grandmother   . Heart attack Paternal Aunt     No Known Allergies  Current Outpatient Medications on File Prior to Visit  Medication Sig Dispense Refill  . Multiple Vitamin (MULTIVITAMIN) tablet Take 1 tablet by mouth daily.    . SUMAtriptan (IMITREX) 100 MG tablet TAKE 1 TAB AT MIGRAINE ONSET,MAY REPEAT IN 2 HOURS IF HEADACHE PERSISTS/ RECURS. MAX 2 TABS IN 24 HR 8 tablet 1   No current facility-administered medications on file prior to visit.     BP 108/70   Pulse 77   Temp (!) 97.1 F (36.2 C) (Temporal)   Ht 5' 9.5" (1.765 m)   Wt 194 lb 4 oz (88.1 kg)   SpO2 99%   BMI 28.27 kg/m    Objective:   Physical Exam  Constitutional: He is oriented to person, place, and time. He appears well-nourished.  HENT:  Right Ear: Tympanic membrane and ear canal normal.  Left Ear: Tympanic membrane and ear canal normal.  Mouth/Throat: Oropharynx is clear and moist.  Eyes: Pupils are equal, round, and  reactive to light. EOM are normal.  Neck: Neck supple.  Cardiovascular: Normal rate and regular rhythm.  Respiratory: Effort normal and breath sounds normal.  GI: Soft. Bowel sounds are normal. There is no abdominal tenderness.  Musculoskeletal: Normal range of motion.  Neurological: He is alert and oriented to person, place, and time.  Skin: Skin is warm and dry.  Nevus to left anterior lower extremity   Psychiatric: He has a normal mood and affect.           Assessment & Plan:

## 2019-01-25 NOTE — Assessment & Plan Note (Signed)
Followed with orthopedics last year who recommended chiropractor. He is now working with a Physiological scientist and has noticed some improvement. No improvement with methocarbamol or meloxicam.  Will trial low dose gabapentin HS, he will update.

## 2019-01-25 NOTE — Assessment & Plan Note (Signed)
Infrequent symptoms overall, using omeprazole PRN. Continue to monitor.

## 2019-01-25 NOTE — Assessment & Plan Note (Signed)
Overall infrequent, using sumatriptan as needed with resolve. Continue to monitor.

## 2019-02-16 ENCOUNTER — Other Ambulatory Visit: Payer: Self-pay | Admitting: Primary Care

## 2019-02-16 DIAGNOSIS — G43701 Chronic migraine without aura, not intractable, with status migrainosus: Secondary | ICD-10-CM

## 2019-12-23 ENCOUNTER — Other Ambulatory Visit: Payer: Self-pay | Admitting: Primary Care

## 2019-12-23 DIAGNOSIS — G43701 Chronic migraine without aura, not intractable, with status migrainosus: Secondary | ICD-10-CM

## 2020-02-02 ENCOUNTER — Telehealth: Payer: No Typology Code available for payment source | Admitting: Nurse Practitioner

## 2020-02-02 DIAGNOSIS — B9789 Other viral agents as the cause of diseases classified elsewhere: Secondary | ICD-10-CM

## 2020-02-02 DIAGNOSIS — J329 Chronic sinusitis, unspecified: Secondary | ICD-10-CM | POA: Diagnosis not present

## 2020-02-02 MED ORDER — FLUTICASONE PROPIONATE 50 MCG/ACT NA SUSP: 2.0000 | Freq: Every day | NASAL | 6 refills | Status: AC

## 2020-02-02 NOTE — Telephone Encounter (Signed)
Called patient to schedule visit. Pt stated he was already seen virtually and given script. Declined appointment at this time

## 2020-02-02 NOTE — Progress Notes (Signed)

## 2020-04-11 ENCOUNTER — Ambulatory Visit (INDEPENDENT_AMBULATORY_CARE_PROVIDER_SITE_OTHER): Payer: No Typology Code available for payment source | Admitting: Primary Care

## 2020-04-11 ENCOUNTER — Encounter: Payer: Self-pay | Admitting: Primary Care

## 2020-04-11 ENCOUNTER — Other Ambulatory Visit: Payer: Self-pay

## 2020-04-11 VITALS — BP 100/68 | HR 72 | Temp 97.5°F | Ht 69.5 in | Wt 190.0 lb

## 2020-04-11 DIAGNOSIS — G43701 Chronic migraine without aura, not intractable, with status migrainosus: Secondary | ICD-10-CM | POA: Diagnosis not present

## 2020-04-11 DIAGNOSIS — Z1211 Encounter for screening for malignant neoplasm of colon: Secondary | ICD-10-CM

## 2020-04-11 DIAGNOSIS — K219 Gastro-esophageal reflux disease without esophagitis: Secondary | ICD-10-CM

## 2020-04-11 DIAGNOSIS — M542 Cervicalgia: Secondary | ICD-10-CM

## 2020-04-11 DIAGNOSIS — Z0000 Encounter for general adult medical examination without abnormal findings: Secondary | ICD-10-CM

## 2020-04-11 DIAGNOSIS — Z1159 Encounter for screening for other viral diseases: Secondary | ICD-10-CM

## 2020-04-11 DIAGNOSIS — G8929 Other chronic pain: Secondary | ICD-10-CM

## 2020-04-11 IMAGING — DX DG ABDOMEN 2V
2 series · 2 of 2 positions shown · non-contrast
Comparison: None.

CLINICAL DATA: Right upper quadrant abdominal pain.  Constipation.

EXAM:
ABDOMEN - 2 VIEW

[abdomen erect]
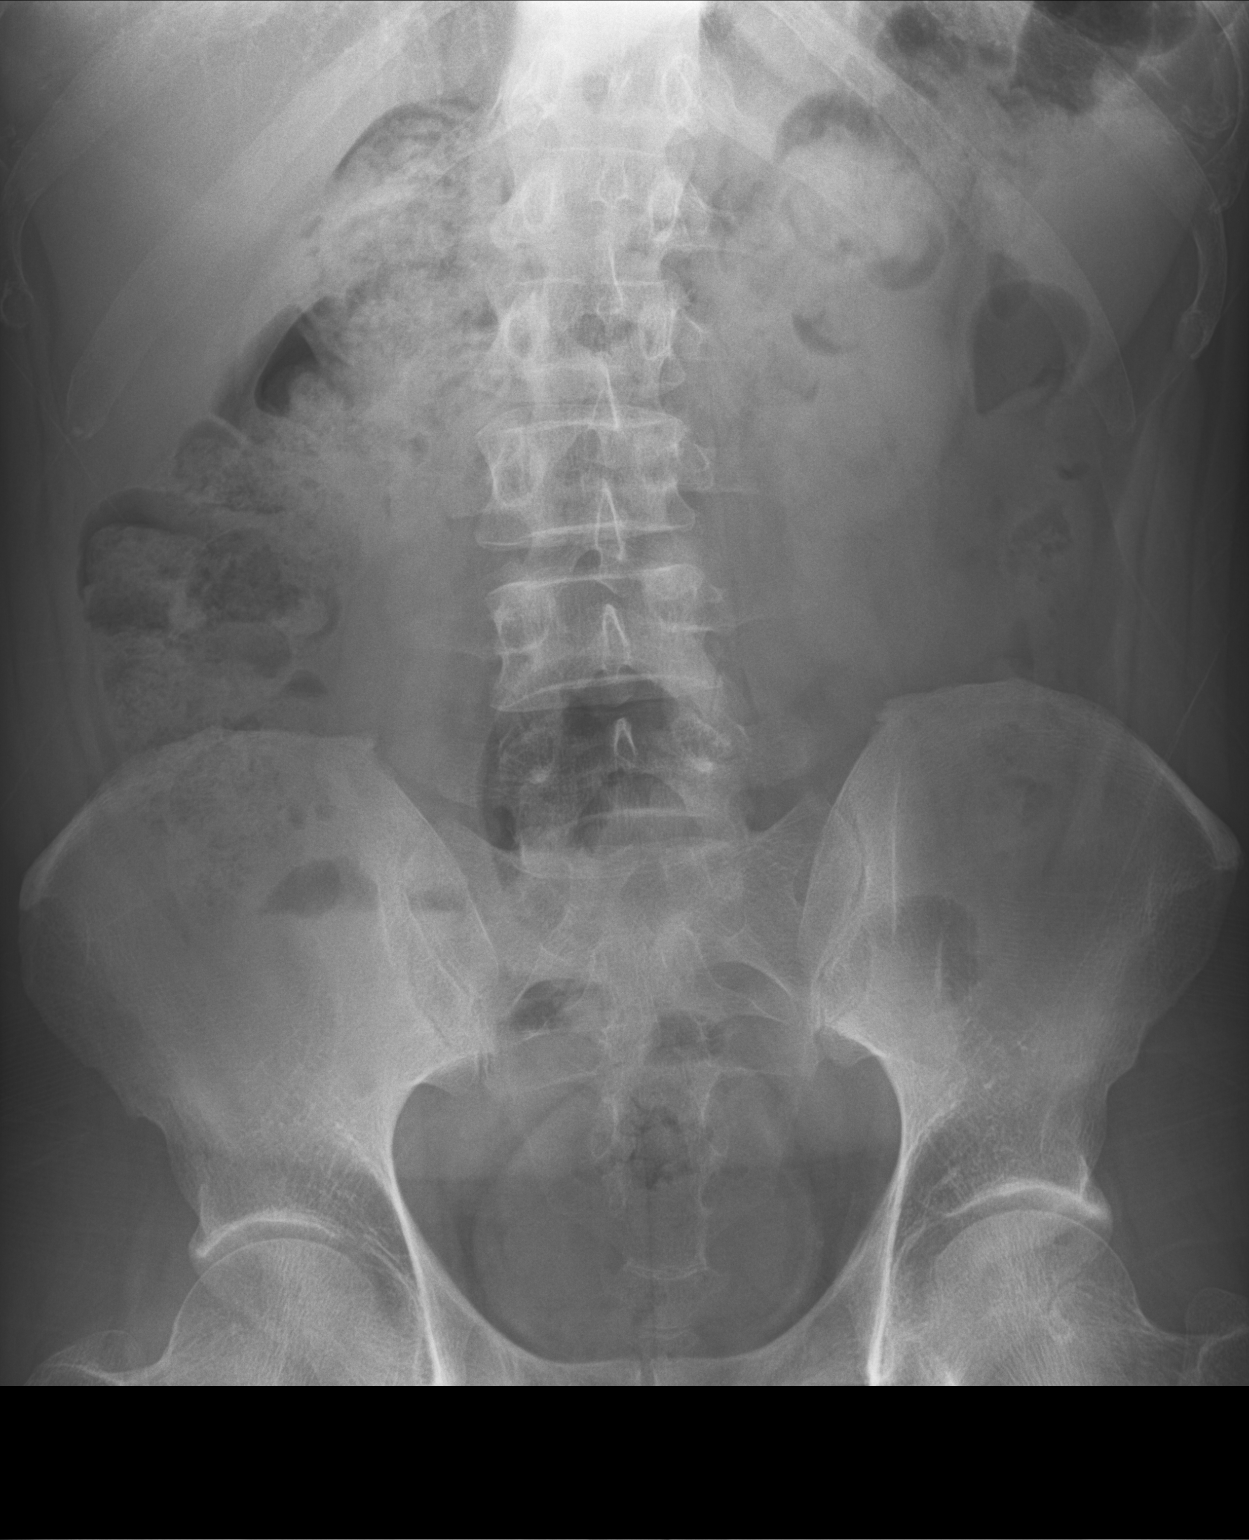

[abdomen supine]
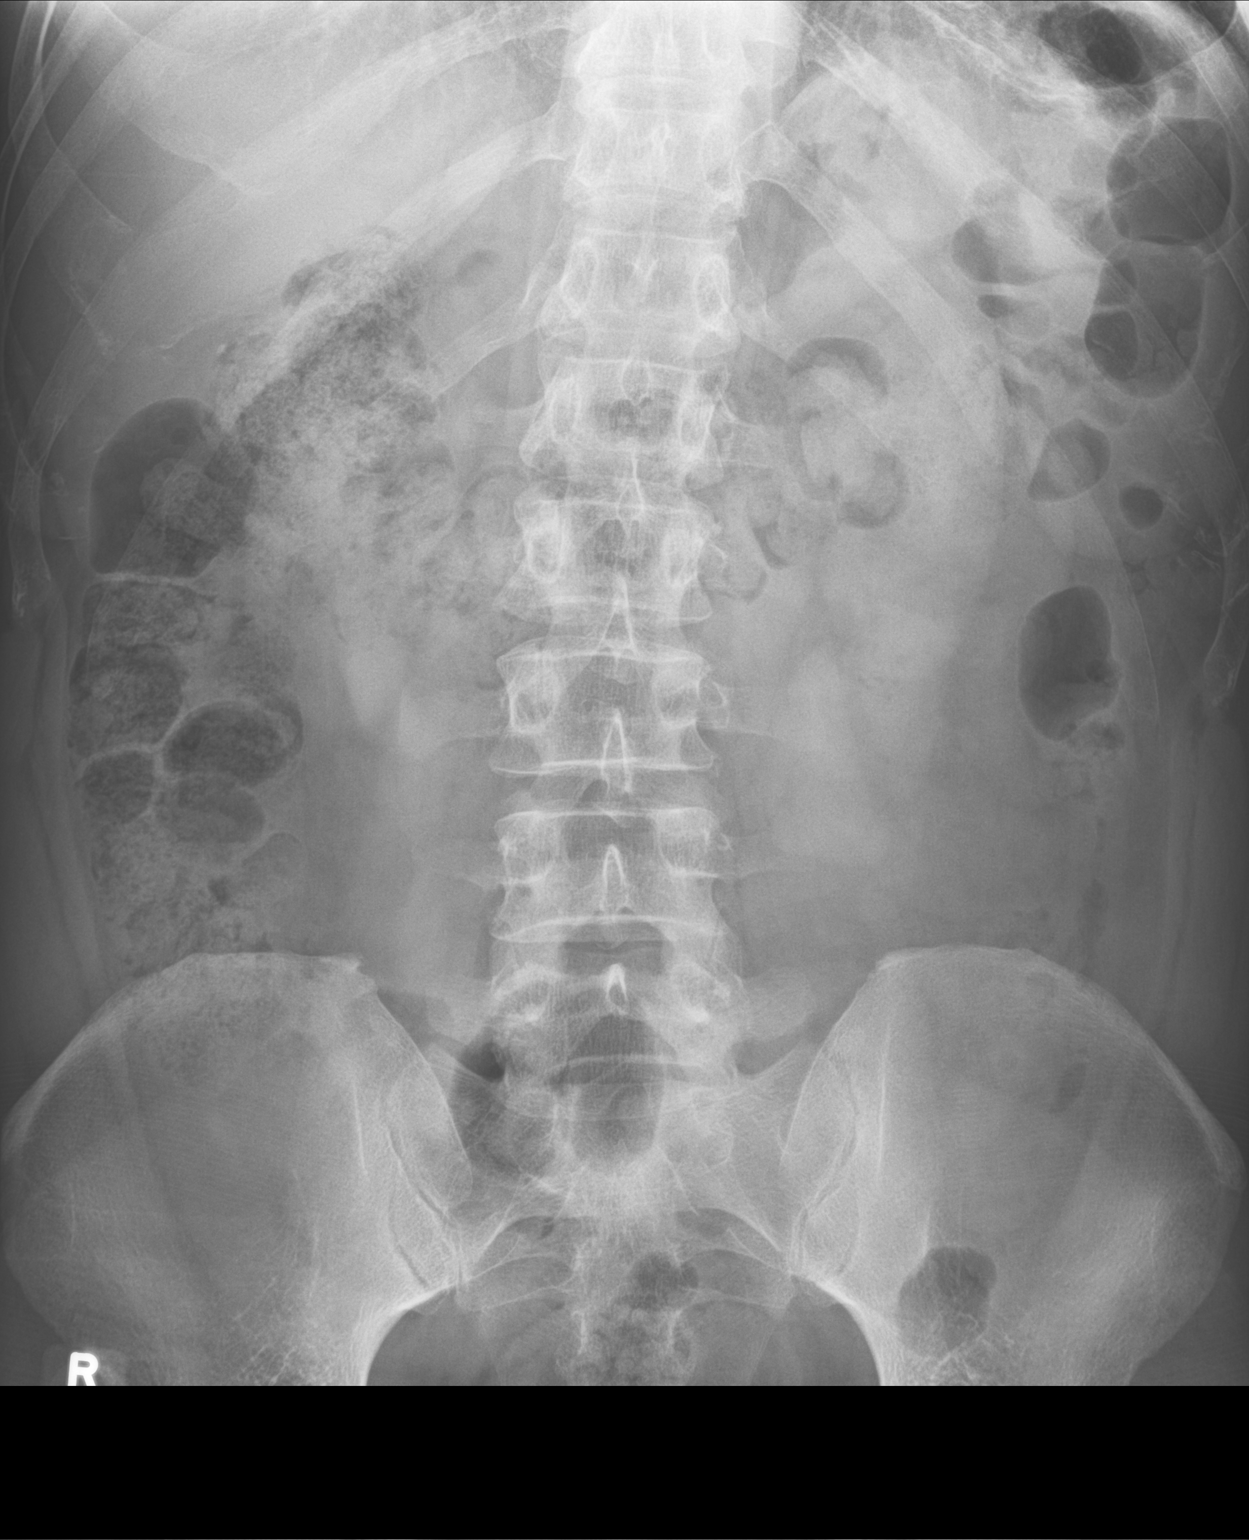

[2 of 2 positions shown; findings below may reference images not displayed]

FINDINGS: The bowel gas pattern is normal. Moderate amount of stool seen in
the right and transverse colon. There is no evidence of free air. No
radio-opaque calculi or other significant radiographic abnormality
is seen.
IMPRESSION: Moderate stool burden is noted. No evidence of bowel obstruction or
ileus.

## 2020-04-11 NOTE — Assessment & Plan Note (Signed)
Chronic and ongoing, saw orthopedics last year, he is managing okay without medications and also with his newer bed and pillow.   Continue to monitor.

## 2020-04-11 NOTE — Progress Notes (Signed)
Subjective:    Patient ID: Ronald Alvarado, male    DOB: 07-10-1975, 45 y.o.   MRN: [id number]  HPI  This visit occurred during the SARS-CoV-2 public health emergency.  Safety protocols were in place, including screening questions prior to the visit, additional usage of staff PPE, and extensive cleaning of exam room while observing appropriate contact time as indicated for disinfecting solutions.   Ronald Alvarado is a 45 year old male who presents today for complete physical.  Immunizations: -Tetanus: Completed in 2019 -Influenza: Declines  -Covid-19: He is contemplating.  Diet: He endorses a fair diet, is working to get back on track. Exercise: He is working on resuming exercising.  Eye exam: Due Dental exam: Completes semi-annually   Colonoscopy: Due  BP Readings from Last 3 Encounters:  04/11/20 100/68  01/25/19 108/70  03/19/18 116/80   Wt Readings from Last 3 Encounters:  04/11/20 190 lb (86.2 kg)  01/25/19 194 lb 4 oz (88.1 kg)  03/19/18 194 lb 8 oz (88.2 kg)     Review of Systems  Constitutional: Negative for unexpected weight change.  HENT: Negative for rhinorrhea.   Eyes: Positive for visual disturbance.  Respiratory: Negative for cough and shortness of breath.   Cardiovascular: Negative for chest pain.  Gastrointestinal: Negative for constipation and diarrhea.  Genitourinary: Negative for difficulty urinating.  Musculoskeletal: Positive for arthralgias and neck pain.  Skin: Negative for rash.  Allergic/Immunologic: Negative for environmental allergies.  Neurological: Positive for headaches. Negative for dizziness and numbness.  Psychiatric/Behavioral: The patient is not nervous/anxious.        Past Medical History:  Diagnosis Date  . GERD (gastroesophageal reflux disease)   . Migraines      Social History   Socioeconomic History  . Marital status: Married    Spouse name: Not on file  . Number of children: Not on file  . Years of education: Not on  file  . Highest education level: Not on file  Occupational History  . Not on file  Tobacco Use  . Smoking status: Former Smoker    Quit date: 01/06/2009    Years since quitting: 11.2  . Smokeless tobacco: Never Used  Substance and Sexual Activity  . Alcohol use: Not on file  . Drug use: Not on file  . Sexual activity: Not on file  Other Topics Concern  . Not on file  Social History Narrative   Married.   Adopted daughter.   Works as Curator.   Enjoys spending time with his daughter, golfing, baseball.   Social Determinants of Health   Financial Resource Strain: Not on file  Food Insecurity: Not on file  Transportation Needs: Not on file  Physical Activity: Not on file  Stress: Not on file  Social Connections: Not on file  Intimate Partner Violence: Not on file    Past Surgical History:  Procedure Laterality Date  . WISDOM TOOTH EXTRACTION      Family History  Problem Relation Age of Onset  . Heart attack Paternal Grandmother   . Heart attack Paternal Aunt     No Known Allergies  Current Outpatient Medications on File Prior to Visit  Medication Sig Dispense Refill  . fluticasone (FLONASE) 50 MCG/ACT nasal spray Place 2 sprays into both nostrils daily. 16 g 6  . Multiple Vitamin (MULTIVITAMIN) tablet Take 1 tablet by mouth daily.    . SUMAtriptan (IMITREX) 100 MG tablet TAKE 1 TAB AT MIGRAINE ONSET,MAY REPEAT IN 2 HOURS IF HEADACHE  PERSISTS/ RECURS. MAX 2 TABS IN 24 HR 9 tablet 0   No current facility-administered medications on file prior to visit.    BP 100/68   Pulse 72   Temp (!) 97.5 F (36.4 C) (Temporal)   Ht 5' 9.5" (1.765 m)   Wt 190 lb (86.2 kg)   SpO2 98%   BMI 27.66 kg/m    Objective:   Physical Exam Constitutional:      Appearance: He is well-nourished.  HENT:     Right Ear: Tympanic membrane and ear canal normal.     Left Ear: Tympanic membrane and ear canal normal.     Mouth/Throat:     Mouth: Oropharynx is clear and moist.   Eyes:     Extraocular Movements: EOM normal.     Pupils: Pupils are equal, round, and reactive to light.  Cardiovascular:     Rate and Rhythm: Normal rate and regular rhythm.  Pulmonary:     Effort: Pulmonary effort is normal.     Breath sounds: Normal breath sounds.  Abdominal:     General: Bowel sounds are normal.     Palpations: Abdomen is soft.     Tenderness: There is no abdominal tenderness.  Musculoskeletal:        General: Normal range of motion.     Cervical back: Neck supple.  Skin:    General: Skin is warm and dry.  Neurological:     Mental Status: He is alert and oriented to person, place, and time.     Cranial Nerves: No cranial nerve deficit.     Deep Tendon Reflexes:     Reflex Scores:      Patellar reflexes are 2+ on the right side and 2+ on the left side. Psychiatric:        Mood and Affect: Mood and affect and mood normal.            Assessment & Plan:

## 2020-04-11 NOTE — Assessment & Plan Note (Signed)
Tetanus UTD, declines influenza vaccination. Colonoscopy due, referral placed to GI. Discussed the importance of a healthy diet and regular exercise in order for weight loss, and to reduce the risk of any potential medical problems.  Exam today benign. Labs pending.

## 2020-04-11 NOTE — Assessment & Plan Note (Signed)
Denies concerns for GERD. He is monitoring with weight loss.  Continue to monitor.

## 2020-04-11 NOTE — Patient Instructions (Signed)
Stop by the lab prior to leaving today. I will notify you of your results once received.   Start exercising. You should be getting 150 minutes of moderate intensity exercise weekly.  Continue to work on a healthy diet. Ensure you are consuming 64 ounces of water daily.  You will be contacted regarding your referral to GI for the colonoscopy.  Please let us know if you have not been contacted within two weeks.    It was a pleasure to see you today!   Preventive Care 70-40 Years Old, Male Preventive care refers to lifestyle choices and visits with your health care provider that can promote health and wellness. This includes:  A yearly physical exam. This is also called an annual well check.  Regular dental and eye exams.  Immunizations.  Screening for certain conditions.  Healthy lifestyle choices, such as eating a healthy diet, getting regular exercise, not using drugs or products that contain nicotine and tobacco, and limiting alcohol use. What can I expect for my preventive care visit? Physical exam Your health care provider will check:  Height and weight. These may be used to calculate body mass index (BMI), which is a measurement that tells if you are at a healthy weight.  Heart rate and blood pressure.  Your skin for abnormal spots. Counseling Your health care provider may ask you questions about:  Alcohol, tobacco, and drug use.  Emotional well-being.  Home and relationship well-being.  Sexual activity.  Eating habits.  Work and work Astronomer. What immunizations do I need?  Influenza (flu) vaccine  This is recommended every year. Tetanus, diphtheria, and pertussis (Tdap) vaccine  You may need a Td booster every 10 years. Varicella (chickenpox) vaccine  You may need this vaccine if you have not already been vaccinated. Zoster (shingles) vaccine  You may need this after age 75. Measles, mumps, and rubella (MMR) vaccine  You may need at least one  dose of MMR if you were born in 1957 or later. You may also need a second dose. Pneumococcal conjugate (PCV13) vaccine  You may need this if you have certain conditions and were not previously vaccinated. Pneumococcal polysaccharide (PPSV23) vaccine  You may need one or two doses if you smoke cigarettes or if you have certain conditions. Meningococcal conjugate (MenACWY) vaccine  You may need this if you have certain conditions. Hepatitis A vaccine  You may need this if you have certain conditions or if you travel or work in places where you may be exposed to hepatitis A. Hepatitis B vaccine  You may need this if you have certain conditions or if you travel or work in places where you may be exposed to hepatitis B. Haemophilus influenzae type b (Hib) vaccine  You may need this if you have certain risk factors. Human papillomavirus (HPV) vaccine  If recommended by your health care provider, you may need three doses over 6 months. You may receive vaccines as individual doses or as more than one vaccine together in one shot (combination vaccines). Talk with your health care provider about the risks and benefits of combination vaccines. What tests do I need? Blood tests  Lipid and cholesterol levels. These may be checked every 5 years, or more frequently if you are over 39 years old.  Hepatitis C test.  Hepatitis B test. Screening  Lung cancer screening. You may have this screening every year starting at age 64 if you have a 30-pack-year history of smoking and currently smoke or have quit  within the past 15 years.  Prostate cancer screening. Recommendations will vary depending on your family history and other risks.  Colorectal cancer screening. All adults should have this screening starting at age 72 and continuing until age 53. Your health care provider may recommend screening at age 38 if you are at increased risk. You will have tests every 1-10 years, depending on your results  and the type of screening test.  Diabetes screening. This is done by checking your blood sugar (glucose) after you have not eaten for a while (fasting). You may have this done every 1-3 years.  Sexually transmitted disease (STD) testing. Follow these instructions at home: Eating and drinking  Eat a diet that includes fresh fruits and vegetables, whole grains, lean protein, and low-fat dairy products.  Take vitamin and mineral supplements as recommended by your health care provider.  Do not drink alcohol if your health care provider tells you not to drink.  If you drink alcohol: ? Limit how much you have to 0-2 drinks a day. ? Be aware of how much alcohol is in your drink. In the [place] one drink equals one 12 oz bottle of beer (355 mL), one 5 oz glass of wine (148 mL), or one 1 oz glass of hard liquor (44 mL). Lifestyle  Take daily care of your teeth and gums.  Stay active. Exercise for at least 30 minutes on 5 or more days each week.  Do not use any products that contain nicotine or tobacco, such as cigarettes, e-cigarettes, and chewing tobacco. If you need help quitting, ask your health care provider.  If you are sexually active, practice safe sex. Use a condom or other form of protection to prevent STIs (sexually transmitted infections).  Talk with your health care provider about taking a low-dose aspirin every day starting at age 57. What's next?  Go to your health care provider once a year for a well check visit.  Ask your health care provider how often you should have your eyes and teeth checked.  Stay up to date on all vaccines. This information is not intended to replace advice given to you by your health care provider. Make sure you discuss any questions you have with your health care provider. Document Revised: 03/19/2018 Document Reviewed: 03/19/2018 Elsevier Patient Education  2020 [employer].

## 2020-04-11 NOTE — Assessment & Plan Note (Signed)
Increased migraines for which he attributes to visual changes. Doing well on sumatriptan for which he uses PRN.  He will schedule an eye exam.

## 2020-04-12 LAB — CBC
HCT: 42.7 % (ref 39.0–52.0)
Hemoglobin: 14.3 g/dL (ref 13.0–17.0)
MCHC: 33.5 g/dL (ref 30.0–36.0)
MCV: 90.5 fl (ref 78.0–100.0)
Platelets: 192 10*3/uL (ref 150.0–400.0)
RBC: 4.72 Mil/uL (ref 4.22–5.81)
RDW: 13.4 % (ref 11.5–15.5)
WBC: 4.4 10*3/uL (ref 4.0–10.5)

## 2020-04-12 LAB — HEPATITIS C ANTIBODY
Hepatitis C Ab: NONREACTIVE
SIGNAL TO CUT-OFF: 0 (ref <1.00)

## 2020-04-12 LAB — COMPREHENSIVE METABOLIC PANEL
ALT: 19 U/L (ref 0–53)
AST: 22 U/L (ref 0–37)
Albumin: 4.6 g/dL (ref 3.5–5.2)
Alkaline Phosphatase: 82 U/L (ref 39–117)
BUN: 16 mg/dL (ref 6–23)
CO2: 32 mEq/L (ref 19–32)
Calcium: 9.5 mg/dL (ref 8.4–10.5)
Chloride: 101 mEq/L (ref 96–112)
Creatinine, Ser: 1.21 mg/dL (ref 0.40–1.50)
GFR: 72.03 mL/min (ref >60.00)
Glucose, Bld: 93 mg/dL (ref 70–99)
Potassium: 3.8 mEq/L (ref 3.5–5.1)
Sodium: 139 mEq/L (ref 135–145)
Total Bilirubin: 0.4 mg/dL (ref 0.2–1.2)
Total Protein: 7.4 g/dL (ref 6.0–8.3)

## 2020-04-12 LAB — LIPID PANEL
Cholesterol: 247 mg/dL — ABNORMAL HIGH (ref 0–200)
HDL: 53.4 mg/dL (ref >39.00)
NonHDL: 193.15
Total CHOL/HDL Ratio: 5
Triglycerides: 329 mg/dL — ABNORMAL HIGH (ref 0.0–149.0)
VLDL: 65.8 mg/dL — ABNORMAL HIGH (ref 0.0–40.0)

## 2020-04-12 LAB — LDL CHOLESTEROL, DIRECT: Direct LDL: 144 mg/dL

## 2020-05-14 IMAGING — US US ABDOMEN LIMITED
2 series · 15 of 25 positions shown · non-contrast
Comparison: None.

CLINICAL DATA: Right upper quadrant pain

EXAM:
ULTRASOUND ABDOMEN LIMITED RIGHT UPPER QUADRANT

[Series 1: us abdomen limited · 14 of 58 slices shown (1 of 2)]
[im 1/58]
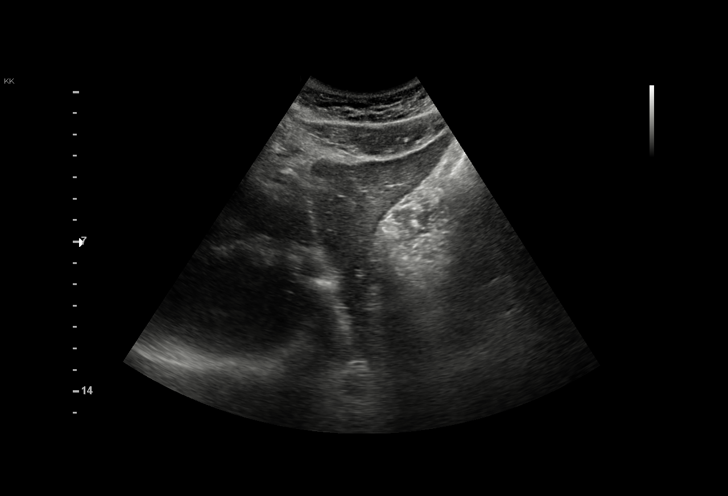
[im 6/58]
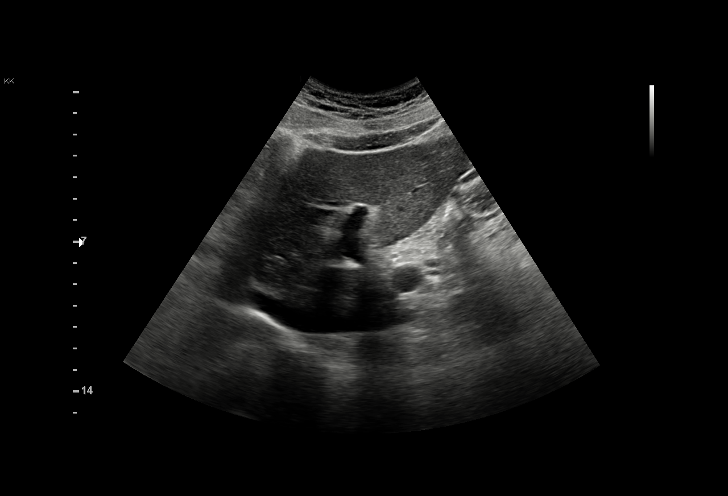
[im 11/58]
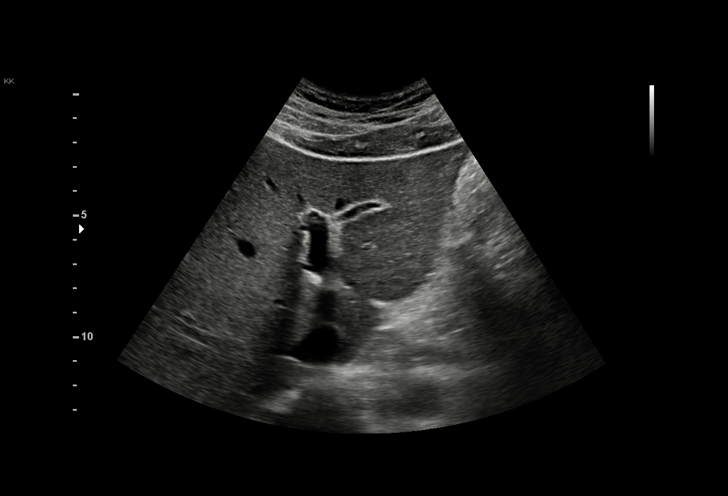
[im 13/58]
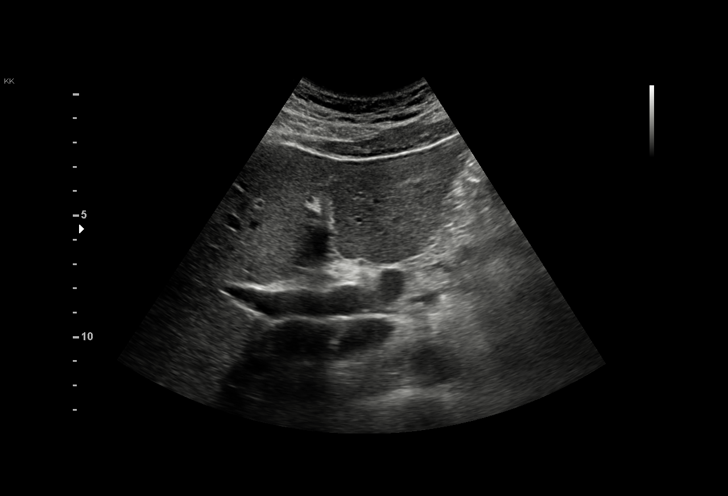
[im 19/58]
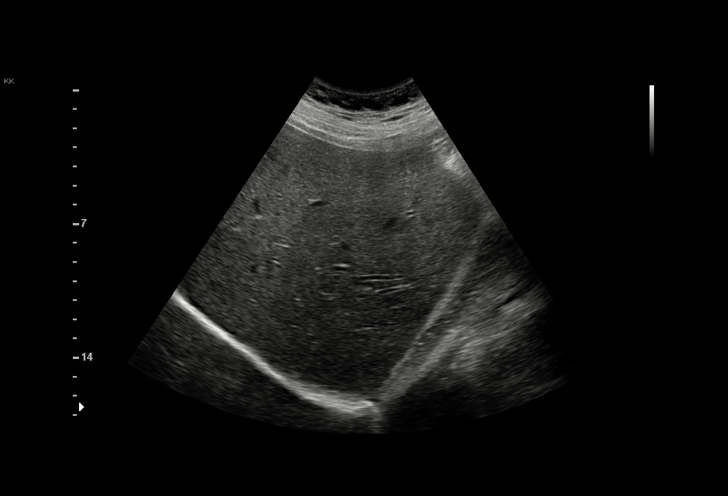
[im 24/58]
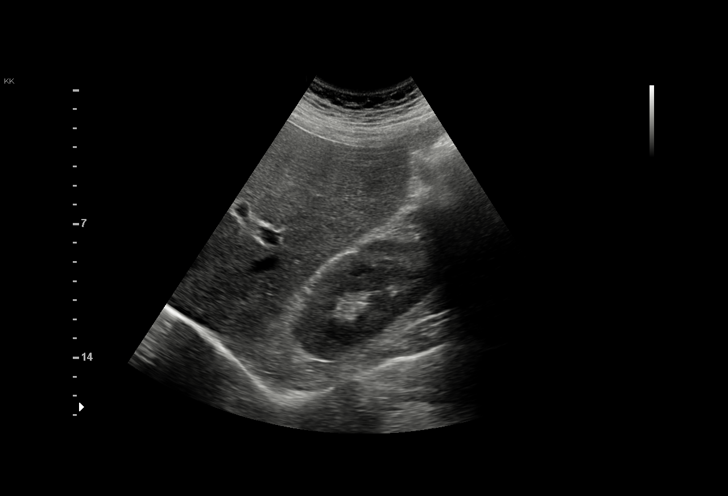
[im 26/58]
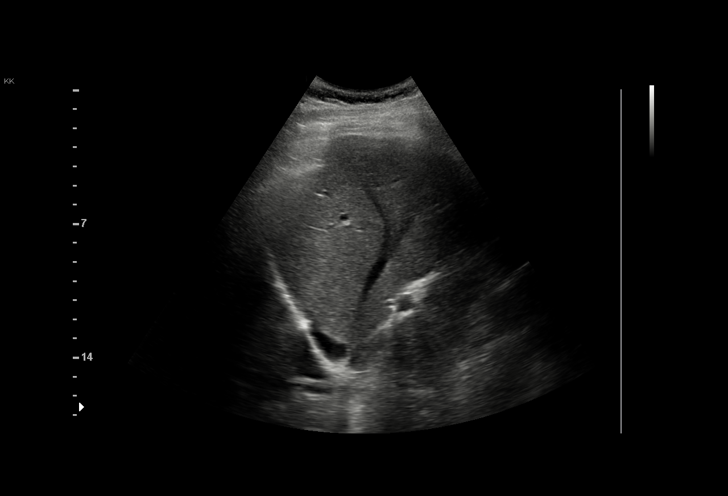
[im 32/58]
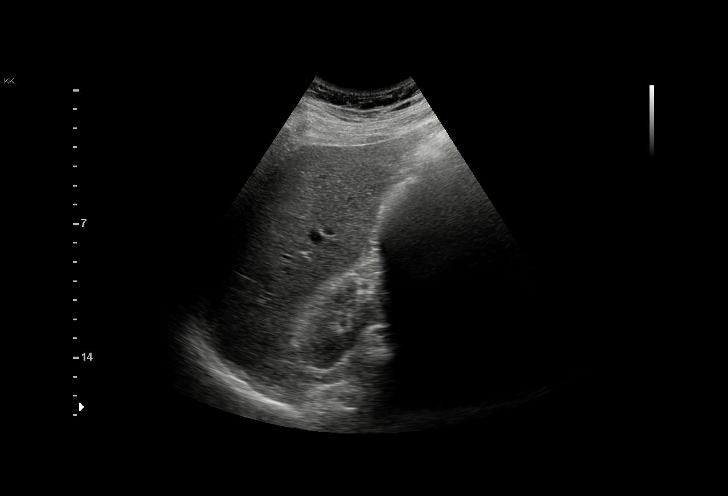
[im 37/58]
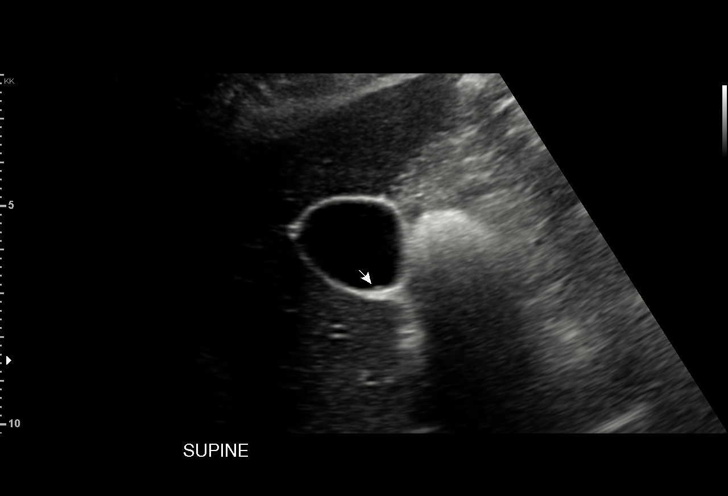
[im 39/58]
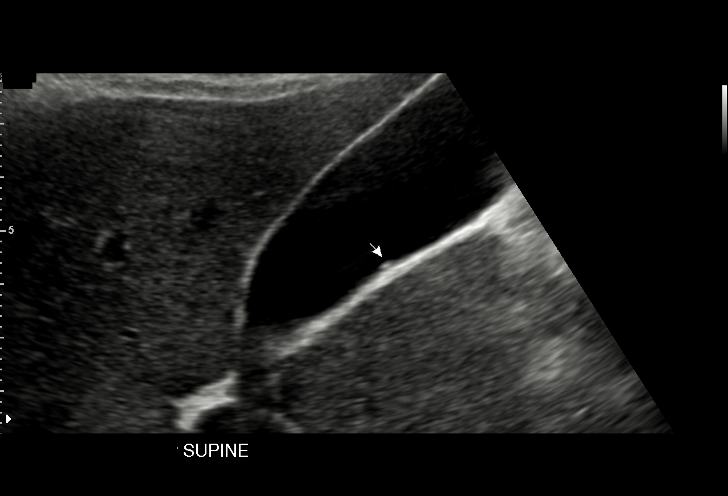
[im 45/58]
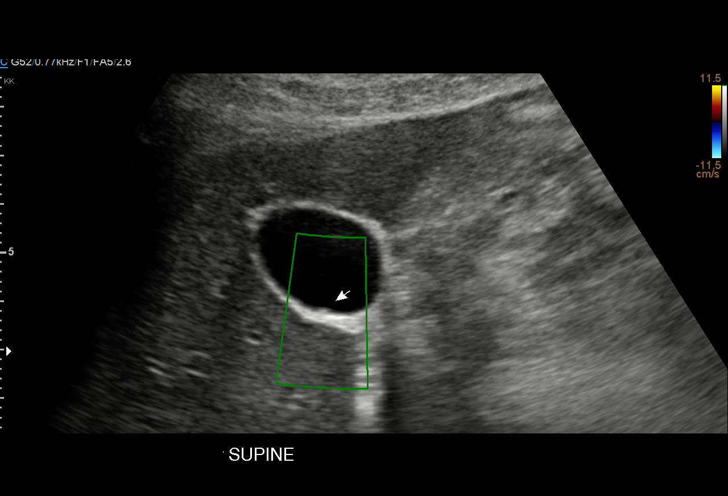
[im 50/58]
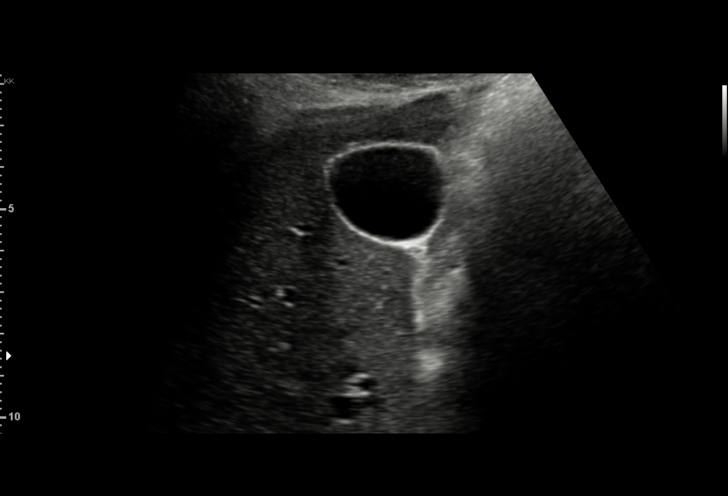
[im 52/58]
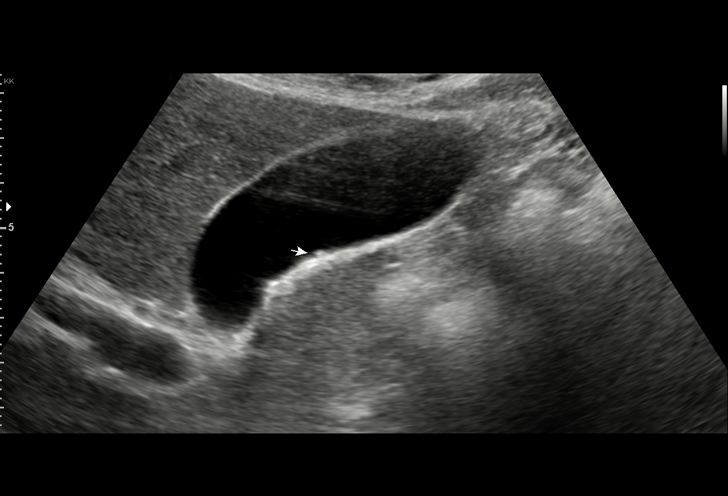
[im 58/58]
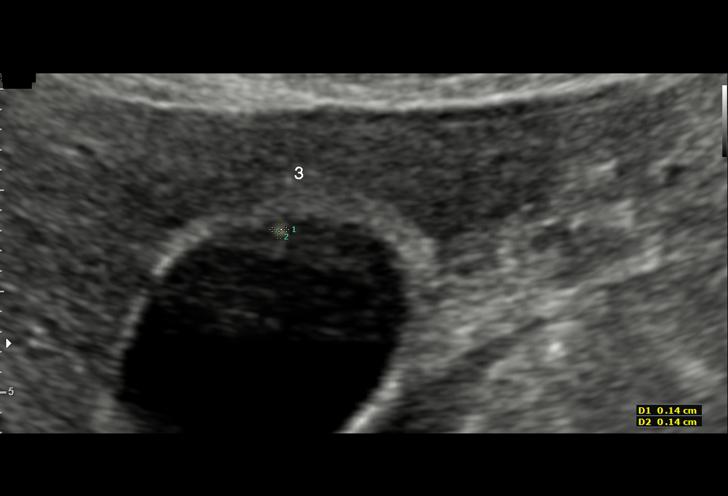

[Series 2: us abdomen limited · 1 of 5 slices shown (2 of 2)]
[im 5/5]
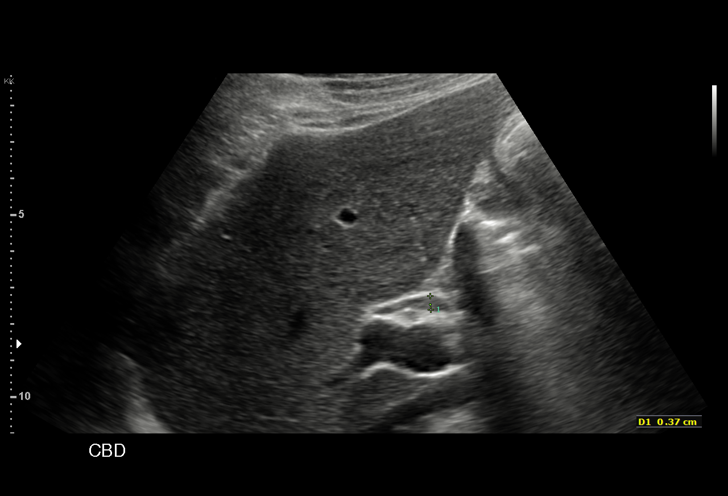

[15 of 25 positions shown; findings below may reference images not displayed]

FINDINGS: Gallbladder:

Within the gallbladder, there is a 2 mm echogenic focus along the
anterior wall which neither moves nor shadows, a likely polyp. There
are no echogenic foci in the gallbladder which move and shadow as is
expected with cholelithiasis. No gallbladder wall thickening or
pericholecystic fluid. No sonographic Murphy sign noted by
sonographer.

Common bile duct:

Diameter: 4 mm. No intrahepatic or extrahepatic biliary duct
dilatation.

Liver:

No focal lesion identified. Within normal limits in parenchymal
echogenicity. Portal vein is patent on color Doppler imaging with
normal direction of blood flow towards the liver.
IMPRESSION: 2 mm apparent polyp along the anterior gallbladder wall. Per
consensus guidelines, a polyp of this small size does not warrant
additional imaging surveillance. Gallbladder otherwise appears
unremarkable.

Study otherwise unremarkable.

## 2020-07-03 ENCOUNTER — Encounter: Payer: No Typology Code available for payment source | Admitting: Gastroenterology

## 2020-07-14 ENCOUNTER — Encounter: Payer: Self-pay | Admitting: Gastroenterology

## 2020-08-12 ENCOUNTER — Telehealth: Payer: No Typology Code available for payment source | Admitting: Nurse Practitioner

## 2020-08-12 DIAGNOSIS — R6889 Other general symptoms and signs: Secondary | ICD-10-CM

## 2020-08-12 NOTE — Progress Notes (Signed)
Based on what you shared with me, I feel your condition warrants further evaluation and I recommend that you be seen in a face to face office visit. This sounds like it could be the flu. You should be tested for proper treatment.   NOTE: If you entered your credit card information for this eVisit, you will not be charged. You may see a "hold" on your card for the $35 but that hold will drop off and you will not have a charge processed.   If you are having a true medical emergency please call 911.      For an urgent face to face visit, [facility] has six urgent care centers for your convenience:     [facility] [place] [place] Get Driving Directions [phone] [place], [place] [address] . 8 am - 4 pm Monday - Friday    [facility]) Get Driving Directions [phone] [address] [place], [place] [address] . 8 am to 8 pm Monday-Friday . 10 am to 6 pm [facility]) Get Driving Directions [phone]  [address] [place],  [place]  [address] . 8 am to 8 pm Monday-Friday . 8 am to 4 pm [facility] Get Driving Directions [phone] [place], [place], [place] [address] . 8 am to 8 pm Monday-Friday . 8 am to 4 pm [facility] Get Driving Directions  [phone] [address] [place], [place] [address] . 8 am to 8 pm Monday-Friday . 8 am to 4 pm [facility] [place] Get Driving Directions [phone] [address], [place],  [address] . 8 am to 8 pm Monday-Friday . 8 am to 4 pm Saturday-Sunday     Your MyChart E-visit questionnaire answers were reviewed by a board certified advanced clinical practitioner to complete your personal care  plan based on your specific symptoms.  Thank you for using e-Visits.    Marland Kitchen

## 2020-08-13 ENCOUNTER — Encounter (HOSPITAL_COMMUNITY): Payer: Self-pay

## 2020-08-13 ENCOUNTER — Ambulatory Visit (HOSPITAL_COMMUNITY)
Admission: EM | Admit: 2020-08-13 | Discharge: 2020-08-13 | Disposition: A | Discharge status: Home / Self Care | Payer: No Typology Code available for payment source | Attending: Student | Admitting: Student

## 2020-08-13 DIAGNOSIS — J101 Influenza due to other identified influenza virus with other respiratory manifestations: Secondary | ICD-10-CM

## 2020-08-13 DIAGNOSIS — Z1152 Encounter for screening for COVID-19: Secondary | ICD-10-CM

## 2020-08-13 DIAGNOSIS — J069 Acute upper respiratory infection, unspecified: Secondary | ICD-10-CM

## 2020-08-13 DIAGNOSIS — Z20822 Contact with and (suspected) exposure to covid-19: Secondary | ICD-10-CM | POA: Diagnosis not present

## 2020-08-13 DIAGNOSIS — J029 Acute pharyngitis, unspecified: Secondary | ICD-10-CM | POA: Diagnosis not present

## 2020-08-13 LAB — POC INFLUENZA A AND B ANTIGEN (URGENT CARE ONLY)
INFLUENZA A ANTIGEN, POC: POSITIVE — AB
INFLUENZA B ANTIGEN, POC: NEGATIVE

## 2020-08-13 LAB — SARS CORONAVIRUS 2 (TAT 6-24 HRS): SARS Coronavirus 2: POSITIVE — AB

## 2020-08-13 MED ORDER — PROMETHAZINE-DM 6.25-15 MG/5ML PO SYRP: 5.0000 mL | ORAL_SOLUTION | Freq: Four times a day (QID) | ORAL | 0 refills | Status: DC | PRN

## 2020-08-13 MED ORDER — LIDOCAINE VISCOUS HCL 2 % MT SOLN: 15.0000 mL | OROMUCOSAL | 0 refills | Status: DC | PRN

## 2020-08-13 MED ORDER — BENZONATATE 100 MG PO CAPS: 100.0000 mg | ORAL_CAPSULE | Freq: Three times a day (TID) | ORAL | 0 refills | Status: DC

## 2020-08-13 NOTE — Discharge Instructions (Addendum)
-  You have tested positive for flu A. We're also testing for covid- this test should come back tomorrow. It'll go straight to your email and mychart. -With a virus like the flu, you're typically contagious for 5-7 days, or as long as you're having fevers.  This is typically also how long you will have symptoms.  You should slowly start to feel better as you approach the end of this time. -Promethazine DM cough syrup for congestion/cough. This could make you drowsy, so take at night before bed. -Tessalon (Benzonatate) as needed for cough. Take one pill up to 3x daily (every 8 hours) -For sore throat, use lidocaine mouthwash up to every 4 hours. Make sure not to eat for at least 1 hour after using this, as your mouth will be very numb and you could bite yourself. -For fever/chills, body aches, headaches- Take Tylenol 1000 mg 3 times daily, and ibuprofen 800 mg 3 times daily with food.  You can take these together, or alternate every 3-4 hours. -You can continue the Mucinex D for during the daytime. -It is normal to feel sick when you have the flu, but seek additional medical attention if things get worse.  This includes new shortness of breath, new chest pain, new dizziness that persists, fevers of 103 that do not reduce with Tylenol/ibuprofen, etc.

## 2020-08-13 NOTE — ED Provider Notes (Signed)
[name]    CSN: [id number] Arrival date & time: 08/13/20  1253      History   Chief Complaint Chief Complaint  Patient presents with  . Headache  . Sore Throat  . Fever    HPI Ronald Alvarado is a 45 y.o. male presenting with fevers, chills, productive cough, fatigue following exposure to COVID. States he has taken 3 covid tests already and they have all been negative. Took a home test on day one and day two, and took a [facility] rapid test on day 2.  Medical history GERD, migraines. Tylenol/ibuprofen and Mucinex D providing some relief.  Fevers have been as high as 102.7 two ago.  Subjective chills today and yesterday but no fevers.  Decreased appetite and occasional nausea but no vomiting or diarrhea.  Productive cough with dark yellow mucus.  Significant fatigue. Denies v/d, shortness of breath, chest pain, facial pain, teeth pain, headaches, sore throat, loss of taste/smell, ear pain. Denies history cardiopulmonary disease.  HPI  Past Medical History:  Diagnosis Date  . GERD (gastroesophageal reflux disease)   . Migraines     Patient Active Problem List   Diagnosis Date Noted  . Right upper quadrant abdominal pain 03/19/2018  . Preventative health care 11/11/2017  . Chronic neck pain 11/11/2017  . Erectile dysfunction 11/11/2017  . Chronic migraine without aura with status migrainosus, not intractable 08/13/2016  . Gastroesophageal reflux disease 08/13/2016    Past Surgical History:  Procedure Laterality Date  . WISDOM TOOTH EXTRACTION         Home Medications    Prior to Admission medications   Medication Sig Start Date End Date Taking? Authorizing Provider  benzonatate (TESSALON) 100 MG capsule Take 1 capsule (100 mg total) by mouth every 8 (eight) hours. 08/13/20  Yes Hazel Sams, PA-C  lidocaine (XYLOCAINE) 2 % solution Use as directed 15 mLs in the mouth or throat as needed for mouth pain. 08/13/20  Yes Hazel Sams, PA-C   promethazine-dextromethorphan (PROMETHAZINE-DM) 6.25-15 MG/5ML syrup Take 5 mLs by mouth 4 (four) times daily as needed for cough. 08/13/20  Yes Hazel Sams, PA-C  fluticasone (FLONASE) 50 MCG/ACT nasal spray Place 2 sprays into both nostrils daily. 02/02/20   Hassell Done Mary-Margaret, FNP  Multiple Vitamin (MULTIVITAMIN) tablet Take 1 tablet by mouth daily.    [provider]  SUMAtriptan (IMITREX) 100 MG tablet TAKE 1 TAB AT MIGRAINE ONSET,MAY REPEAT IN 2 HOURS IF HEADACHE PERSISTS/ RECURS. MAX 2 TABS IN 24 HR 12/24/19   Pleas Koch, NP    Family History Family History  Problem Relation Age of Onset  . Heart attack Paternal Grandmother   . Heart attack Paternal Aunt     Social History Social History   Tobacco Use  . Smoking status: Former Smoker    Quit date: 01/06/2009    Years since quitting: 11.6  . Smokeless tobacco: Never Used     Allergies   Patient has no known allergies.   Review of Systems Review of Systems  Constitutional: Positive for appetite change, chills, fatigue and fever.  HENT: Positive for congestion. Negative for ear pain, rhinorrhea, sinus pressure, sinus pain and sore throat.   Eyes: Negative for redness and visual disturbance.  Respiratory: Positive for cough. Negative for chest tightness, shortness of breath and wheezing.   Cardiovascular: Negative for chest pain and palpitations.  Gastrointestinal: Positive for nausea. Negative for abdominal pain, constipation, diarrhea and vomiting.  Genitourinary: Negative for dysuria,  frequency and urgency.  Musculoskeletal: Positive for myalgias.  Neurological: Negative for dizziness, weakness and headaches.  Psychiatric/Behavioral: Negative for confusion.  All other systems reviewed and are negative.    Physical Exam Triage Vital Signs ED Triage Vitals  Enc Vitals Group     BP 08/13/20 1333 110/71     Pulse Rate 08/13/20 1333 (!) 103     Resp 08/13/20 1333 20     Temp 08/13/20 1333 98.4  F (36.9 C)     Temp Source 08/13/20 1333 Oral     SpO2 08/13/20 1333 98 %     Weight --      Height --      Head Circumference --      Peak Flow --      Pain Score 08/13/20 1331 0     Pain Loc --      Pain Edu? --      Excl. in [place]? --    No data found.  Updated Vital Signs BP 110/71 (BP Location: Right Arm)   Pulse (!) 103   Temp 98.4 F (36.9 C) (Oral)   Resp 20   SpO2 98%   Visual Acuity Right Eye Distance:   Left Eye Distance:   Bilateral Distance:    Right Eye Near:   Left Eye Near:    Bilateral Near:     Physical Exam Vitals reviewed.  Constitutional:      General: He is not in acute distress.    Appearance: Normal appearance. He is not ill-appearing.  HENT:     Head: Normocephalic and atraumatic.     Right Ear: Hearing, tympanic membrane, ear canal and external ear normal. No swelling or tenderness. There is no impacted cerumen. No mastoid tenderness. Tympanic membrane is not perforated, erythematous, retracted or bulging.     Left Ear: Hearing, tympanic membrane, ear canal and external ear normal. No swelling or tenderness. There is no impacted cerumen. No mastoid tenderness. Tympanic membrane is not perforated, erythematous, retracted or bulging.     Nose:     Right Sinus: No maxillary sinus tenderness or frontal sinus tenderness.     Left Sinus: No maxillary sinus tenderness or frontal sinus tenderness.     Mouth/Throat:     Mouth: Mucous membranes are moist.     Pharynx: Uvula midline. Posterior oropharyngeal erythema present. No oropharyngeal exudate.     Tonsils: No tonsillar exudate. 0 on the right. 0 on the left.     Comments: Smooth erythema posterior pharynx Cardiovascular:     Rate and Rhythm: Normal rate and regular rhythm.     Heart sounds: Normal heart sounds.  Pulmonary:     Breath sounds: Normal breath sounds and air entry. No decreased breath sounds, wheezing, rhonchi or rales.  Chest:     Chest wall: No tenderness.  Abdominal:      General: Abdomen is flat. Bowel sounds are normal.     Tenderness: There is no abdominal tenderness. There is no guarding or rebound.  Lymphadenopathy:     Cervical: Cervical adenopathy present.     Right cervical: Superficial cervical adenopathy present.     Left cervical: Superficial cervical adenopathy present.  Neurological:     General: No focal deficit present.     Mental Status: He is alert and oriented to person, place, and time.  Psychiatric:        Attention and Perception: Attention and perception normal.        Mood and Affect:  Mood and affect normal.        Behavior: Behavior normal. Behavior is cooperative.        Thought Content: Thought content normal.        Judgment: Judgment normal.      UC Treatments / Results  Labs (all labs ordered are listed, but only abnormal results are displayed) Labs Reviewed  POC INFLUENZA A AND B ANTIGEN (URGENT CARE ONLY) - Abnormal; Notable for the following components:      Result Value   INFLUENZA A ANTIGEN, POC POSITIVE (*)    All other components within normal limits  SARS CORONAVIRUS 2 (TAT 6-24 HRS)    EKG   Radiology No results found.  Procedures Procedures (including critical care time)  Medications Ordered in UC Medications - No data to display  Initial Impression / Assessment and Plan / UC Course  I have reviewed the triage vital signs and the nursing notes.  Pertinent labs & imaging results that were available during my care of the patient were reviewed by me and considered in my medical decision making (see chart for details).     This patient is a 45 year old male presenting with Influenza A. Today this pt is mildly tachy at 103, but afebrile nontachypneic, oxygenating well on room air, no wheezes rhonchi or rales.  Last dose of antipyretic was 7 hours ago.  Appears alert and well-hydrated. Denies history cardiopulmonary disease.   Rapid flu A positive. COVID PCR sent given exposure.  Promethazine,  Tessalon, Viscous lidocaine, Tylenol/ibuprofen.   ED return precautions discussed.   Final Clinical Impressions(s) / UC Diagnoses   Final diagnoses:  Influenza A  Encounter for screening for COVID-19  Exposure to COVID-19 virus  Viral URI with cough  Viral pharyngitis     Discharge Instructions     -You have tested positive for flu A. We're also testing for covid- this test should come back tomorrow. It'll go straight to your email and mychart. -With a virus like the flu, you're typically contagious for 5-7 days, or as long as you're having fevers.  This is typically also how long you will have symptoms.  You should slowly start to feel better as you approach the end of this time. -Promethazine DM cough syrup for congestion/cough. This could make you drowsy, so take at night before bed. -Tessalon (Benzonatate) as needed for cough. Take one pill up to 3x daily (every 8 hours) -For sore throat, use lidocaine mouthwash up to every 4 hours. Make sure not to eat for at least 1 hour after using this, as your mouth will be very numb and you could bite yourself. -For fever/chills, body aches, headaches- Take Tylenol 1000 mg 3 times daily, and ibuprofen 800 mg 3 times daily with food.  You can take these together, or alternate every 3-4 hours. -You can continue the Mucinex D for during the daytime. -It is normal to feel sick when you have the flu, but seek additional medical attention if things get worse.  This includes new shortness of breath, new chest pain, new dizziness that persists, fevers of 103 that do not reduce with Tylenol/ibuprofen, etc.     ED Prescriptions    Medication Sig Dispense Auth. Provider   promethazine-dextromethorphan (PROMETHAZINE-DM) 6.25-15 MG/5ML syrup Take 5 mLs by mouth 4 (four) times daily as needed for cough. 118 mL Hazel Sams, PA-C   benzonatate (TESSALON) 100 MG capsule Take 1 capsule (100 mg total) by mouth every 8 (eight) hours. 21 capsule  Hazel Sams, PA-C   lidocaine (XYLOCAINE) 2 % solution Use as directed 15 mLs in the mouth or throat as needed for mouth pain. 100 mL Hazel Sams, PA-C     PDMP not reviewed this encounter.   Hazel Sams, PA-C 08/13/20 1523

## 2020-08-13 NOTE — ED Triage Notes (Signed)
Pt reports he came back from [facility] Thursday and states since he got back he has been having a fever and chills. He states he was exposed to a friend that is COVID positive. Pt states he has been coughing mucus. He states when he walks he gets tired.

## 2020-08-14 ENCOUNTER — Other Ambulatory Visit: Payer: Self-pay | Admitting: Physician Assistant

## 2020-08-14 ENCOUNTER — Telehealth: Payer: Self-pay

## 2020-08-14 ENCOUNTER — Other Ambulatory Visit (HOSPITAL_COMMUNITY): Payer: Self-pay

## 2020-08-14 MED ORDER — NIRMATRELVIR/RITONAVIR (PAXLOVID)TABLET
3.0000 | ORAL_TABLET | Freq: Two times a day (BID) | ORAL | 0 refills | Status: AC
  Filled 2020-08-14: qty 30, 5d supply, fill #0

## 2020-08-14 NOTE — Progress Notes (Signed)
Outpatient Oral COVID Treatment Note  I connected with Brandn Mcgath Scoles on 08/14/2020/10:05 AM by telephone and verified that I am speaking with the correct person using two identifiers.  I discussed the limitations, risks, security, and privacy concerns of performing an evaluation and management service by telephone and the availability of in person appointments. I also discussed with the patient that there may be a patient responsible charge related to this service. The patient expressed understanding and agreed to proceed.  Patient location: Home Provider location: Home  Diagnosis: COVID-19 infection  Purpose of visit: Discussion of potential use of Molnupiravir or Paxlovid, a new treatment for mild to moderate COVID-19 viral infection in non-hospitalized patients.   Subjective: Patient is a 45 y.o. male who has been diagnosed with COVID 19 viral infection.  Their symptoms began on 08/11/20  With fever, headache, sore throat.   Past Medical History:  Diagnosis Date  . GERD (gastroesophageal reflux disease)   . Migraines     No Known Allergies   Current Outpatient Medications:  .  benzonatate (TESSALON) 100 MG capsule, Take 1 capsule (100 mg total) by mouth every 8 (eight) hours., Disp: 21 capsule, Rfl: 0 .  fluticasone (FLONASE) 50 MCG/ACT nasal spray, Place 2 sprays into both nostrils daily., Disp: 16 g, Rfl: 6 .  lidocaine (XYLOCAINE) 2 % solution, Use as directed 15 mLs in the mouth or throat as needed for mouth pain., Disp: 100 mL, Rfl: 0 .  Multiple Vitamin (MULTIVITAMIN) tablet, Take 1 tablet by mouth daily., Disp: , Rfl:  .  promethazine-dextromethorphan (PROMETHAZINE-DM) 6.25-15 MG/5ML syrup, Take 5 mLs by mouth 4 (four) times daily as needed for cough., Disp: 118 mL, Rfl: 0 .  SUMAtriptan (IMITREX) 100 MG tablet, TAKE 1 TAB AT MIGRAINE ONSET,MAY REPEAT IN 2 HOURS IF HEADACHE PERSISTS/ RECURS. MAX 2 TABS IN 24 HR, Disp: 9 tablet, Rfl: 0  Objective: Patient appears/sounds sick with  cough.  They are in no apparent distress.  Breathing is non labored.  Mood and behavior are normal.  Laboratory Data:  Recent Results (from the past 2160 hour(s))  SARS CORONAVIRUS 2 (TAT 6-24 HRS) Nasopharyngeal Nasopharyngeal Swab     Status: Abnormal   Collection Time: 08/13/20  1:58 PM   Specimen: Nasopharyngeal Swab  Result Value Ref Range   SARS Coronavirus 2 POSITIVE (A) NEGATIVE    Comment: (NOTE) SARS-CoV-2 target nucleic acids are DETECTED.  The SARS-CoV-2 RNA is generally detectable in upper and lower respiratory specimens during the acute phase of infection. Positive results are indicative of the presence of SARS-CoV-2 RNA. Clinical correlation with patient history and other diagnostic information is  necessary to determine patient infection status. Positive results do not rule out bacterial infection or co-infection with other viruses.  The expected result is Negative.  Fact Sheet for Patients: SugarRoll.be  Fact Sheet for Healthcare Providers: https://www.woods-mathews.com/  This test is not yet approved or cleared by the [place] FDA and  has been authorized for detection and/or diagnosis of SARS-CoV-2 by FDA under an Emergency Use Authorization (EUA). This EUA will remain  in effect (meaning this test can be used) for the duration of the COVID-19 declaration under Section 564(b)(1) of the Act, 21 U. S.C. section 360bbb-3(b)(1), unless the authorization is terminated or revoked sooner.   Performed at [facility], [place] [address] [place], [place] [address]   POC Influenza A & B Ag (Urgent Care)     Status: Abnormal   Collection Time: 08/13/20  2:38 PM  Result Value Ref Range   INFLUENZA A ANTIGEN, POC POSITIVE (A) NEGATIVE   INFLUENZA B ANTIGEN, POC NEGATIVE NEGATIVE     Assessment: 45 y.o. male with mild/moderate COVID 19 viral infection diagnosed on 08/13/20 at high risk for progression to severe COVID  19.  Plan:  This patient is a 45 y.o. male that meets the following criteria for Emergency Use Authorization of: Paxlovid 1. Age >12 yr AND > 40 kg 2. SARS-COV-2 positive test 3. Symptom onset < 5 days 4. Mild-to-moderate COVID disease with high risk for severe progression to hospitalization or death  I have spoken and communicated the following to the patient or parent/caregiver regarding: 1. Paxlovid is an unapproved drug that is authorized for use under an Emergency Use Authorization.  2. There are no adequate, approved, available products for the treatment of COVID-19 in adults who have mild-to-moderate COVID-19 and are at high risk for progressing to severe COVID-19, including hospitalization or death. 3. Other therapeutics are currently authorized. For additional information on all products authorized for treatment or prevention of COVID-19, please see TanEmporium.pl.  4. There are benefits and risks of taking this treatment as outlined in the "Fact Sheet for Patients and Caregivers."  5. "Fact Sheet for Patients and Caregivers" was reviewed with patient. A hard copy will be provided to patient from pharmacy prior to the patient receiving treatment. 6. Patients should continue to self-isolate and use infection control measures (e.g., wear mask, isolate, social distance, avoid sharing personal items, clean and disinfect "high touch" surfaces, and frequent handwashing) according to CDC guidelines.  7. The patient or parent/caregiver has the option to accept or refuse treatment. 8. Patient medication history was reviewed for potential drug interactions:Interaction with home meds: Flonase nasal spray (Will not take while on Paxlovid) 9. Patient's GFR was calculated to be > 60, and they were therefore prescribed Normal dose (GFR>60) - nirmatrelvir 150mg  tab (2 tablet) by mouth twice daily  AND ritonavir 100mg  tab (1 tablet) by mouth twice daily   After reviewing above information with the patient, the patient agrees to receive Paxlovid.  Follow up instructions:    . Take prescription BID x 5 days as directed . Reach out to pharmacist for counseling on medication if desired . For concerns regarding further COVID symptoms please follow up with your PCP or urgent care . For urgent or life-threatening issues, seek care at your local emergency department  The patient was provided an opportunity to ask questions, and all were answered. The patient agreed with the plan and demonstrated an understanding of the instructions.   Script sent to [facility] and opted to pick up RX.  The patient was advised to call their PCP or seek an in-person evaluation if the symptoms worsen or if the condition fails to improve as anticipated.   I provided 20  minutes of non face-to-face telephone visit time during this encounter, and > 50% was spent counseling as documented under my assessment & plan.  [place], [place] 08/14/2020 /10:05 AM

## 2020-08-14 NOTE — Telephone Encounter (Signed)
Called to discuss with patient about COVID-19 symptoms and the use of one of the available treatments for those with mild to moderate Covid symptoms and at a high risk of hospitalization.  Pt appears to qualify for outpatient treatment due to co-morbid conditions and/or a member of an at-risk group in accordance with the FDA Emergency Use Authorization.    Symptom onset: 08/11/20 Cough,headache, sore throat,fever Vaccinated: No Booster? No Immunocompromised? No Qualifiers: None NIH Criteria: Tier 2   Pt. Would like to speak with APP.   Marcello Moores

## 2020-08-28 ENCOUNTER — Other Ambulatory Visit (HOSPITAL_COMMUNITY): Payer: Self-pay

## 2020-08-28 ENCOUNTER — Ambulatory Visit (AMBULATORY_SURGERY_CENTER): Payer: No Typology Code available for payment source | Admitting: *Deleted

## 2020-08-28 ENCOUNTER — Other Ambulatory Visit: Payer: Self-pay

## 2020-08-28 VITALS — Ht 69.0 in | Wt 174.0 lb

## 2020-08-28 DIAGNOSIS — Z1211 Encounter for screening for malignant neoplasm of colon: Secondary | ICD-10-CM

## 2020-08-28 MED ORDER — SUPREP BOWEL PREP KIT 17.5-3.13-1.6 GM/177ML PO SOLN
1.0000 | Freq: Once | ORAL | 0 refills | Status: AC
  Filled 2020-08-28: qty 354, 1d supply, fill #0

## 2020-08-28 NOTE — Progress Notes (Signed)

## 2020-09-01 ENCOUNTER — Other Ambulatory Visit (HOSPITAL_COMMUNITY): Payer: Self-pay

## 2020-09-01 MED ORDER — SUMATRIPTAN SUCCINATE 100 MG PO TABS
100.0000 mg | ORAL_TABLET | Freq: Every day | ORAL | 0 refills | Status: DC
  Filled 2020-09-01 – 2020-11-27 (×2): qty 9, 30d supply, fill #0

## 2020-09-01 MED ORDER — FLUTICASONE PROPIONATE 50 MCG/ACT NA SUSP
Freq: Every day | NASAL | 5 refills | Status: DC
  Filled 2020-09-01 – 2020-11-27 (×2): qty 16, 30d supply, fill #0

## 2020-09-11 ENCOUNTER — Other Ambulatory Visit (HOSPITAL_COMMUNITY): Payer: Self-pay

## 2020-09-12 ENCOUNTER — Encounter: Payer: No Typology Code available for payment source | Admitting: Gastroenterology

## 2020-10-03 ENCOUNTER — Encounter: Payer: Self-pay | Admitting: Gastroenterology

## 2020-10-03 ENCOUNTER — Ambulatory Visit (AMBULATORY_SURGERY_CENTER): Payer: No Typology Code available for payment source | Admitting: Gastroenterology

## 2020-10-03 ENCOUNTER — Other Ambulatory Visit: Payer: Self-pay

## 2020-10-03 VITALS — BP 90/58 | HR 83 | Temp 98.0°F | Resp 12 | Ht 69.5 in | Wt 174.0 lb

## 2020-10-03 DIAGNOSIS — D125 Benign neoplasm of sigmoid colon: Secondary | ICD-10-CM

## 2020-10-03 DIAGNOSIS — K635 Polyp of colon: Secondary | ICD-10-CM

## 2020-10-03 DIAGNOSIS — Z1211 Encounter for screening for malignant neoplasm of colon: Secondary | ICD-10-CM

## 2020-10-03 DIAGNOSIS — D124 Benign neoplasm of descending colon: Secondary | ICD-10-CM

## 2020-10-03 MED ORDER — SODIUM CHLORIDE 0.9 % IV SOLN: 500.0000 mL | Freq: Once | INTRAVENOUS | Status: DC

## 2020-10-03 NOTE — Patient Instructions (Signed)
YOU HAD AN ENDOSCOPIC PROCEDURE TODAY AT [facility]:   Refer to the procedure report that was given to you for any specific questions about what was found during the examination.  If the procedure report does not answer your questions, please call your gastroenterologist to clarify.  If you requested that your care partner not be given the details of your procedure findings, then the procedure report has been included in a sealed envelope for you to review at your convenience later.  YOU SHOULD EXPECT: Some feelings of bloating in the abdomen. Passage of more gas than usual.  Walking can help get rid of the air that was put into your GI tract during the procedure and reduce the bloating. If you had a lower endoscopy (such as a colonoscopy or flexible sigmoidoscopy) you may notice spotting of blood in your stool or on the toilet paper. If you underwent a bowel prep for your procedure, you may not have a normal bowel movement for a few days.  Please Note:  You might notice some irritation and congestion in your nose or some drainage.  This is from the oxygen used during your procedure.  There is no need for concern and it should clear up in a day or so.  SYMPTOMS TO REPORT IMMEDIATELY:  Following lower endoscopy (colonoscopy or flexible sigmoidoscopy):  Excessive amounts of blood in the stool  Significant tenderness or worsening of abdominal pains  Swelling of the abdomen that is new, acute  Fever of 100F or high   For urgent or emergent issues, a gastroenterologist can be reached at any hour by calling [phone]. Do not use MyChart messaging for urgent concerns.    DIET:  We do recommend a small meal at first, but then you may proceed to your regular diet.  Drink plenty of fluids but you should avoid alcoholic beverages for 24 hours.  ACTIVITY:  You should plan to take it easy for the rest of today and you should NOT DRIVE or use heavy machinery until tomorrow (because of  the sedation medicines used during the test).    FOLLOW UP: Our staff will call the number listed on your records 48-72 hours following your procedure to check on you and address any questions or concerns that you may have regarding the information given to you following your procedure. If we do not reach you, we will leave a message.  We will attempt to reach you two times.  During this call, we will ask if you have developed any symptoms of COVID 19. If you develop any symptoms (ie: fever, flu-like symptoms, shortness of breath, cough etc.) before then, please call [phone].  If you test positive for Covid 19 in the 2 weeks post procedure, please call and report this information to [place].    If any biopsies were taken you will be contacted by phone or by letter within the next 1-3 weeks.  Please call us at [phone] if you have not heard about the biopsies in 3 weeks.    SIGNATURES/CONFIDENTIALITY: You and/or your care partner have signed paperwork which will be entered into your electronic medical record.  These signatures attest to the fact that that the information above on your After Visit Summary has been reviewed and is understood.  Full responsibility of the confidentiality of this discharge information lies with you and/or your care-partner. YOU HAD AN ENDOSCOPIC PROCEDURE TODAY AT THE [facility]:   Refer to the procedure report that  was given to you for any specific questions about what was found during the examination.  If the procedure report does not answer your questions, please call your gastroenterologist to clarify.  If you requested that your care partner not be given the details of your procedure findings, then the procedure report has been included in a sealed envelope for you to review at your convenience later.  YOU SHOULD EXPECT: Some feelings of bloating in the abdomen. Passage of more gas than usual.  Walking can help get rid of the air that was put into your  GI tract during the procedure and reduce the bloating. If you had a lower endoscopy (such as a colonoscopy or flexible sigmoidoscopy) you may notice spotting of blood in your stool or on the toilet paper. If you underwent a bowel prep for your procedure, you may not have a normal bowel movement for a few days.  Please Note:  You might notice some irritation and congestion in your nose or some drainage.  This is from the oxygen used during your procedure.  There is no need for concern and it should clear up in a day or so.  SYMPTOMS TO REPORT IMMEDIATELY:  Following lower endoscopy (colonoscopy or flexible sigmoidoscopy):  Excessive amounts of blood in the stool  Significant tenderness or worsening of abdominal pains  Swelling of the abdomen that is new, acute  Fever of 100F or higher  For urgent or emergent issues, a gastroenterologist can be reached at any hour by calling [phone]. Do not use MyChart messaging for urgent concerns.    DIET:  We do recommend a small meal at first, but then you may proceed to your regular diet.  Drink plenty of fluids but you should avoid alcoholic beverages for 24 hours.  ACTIVITY:  You should plan to take it easy for the rest of today and you should NOT DRIVE or use heavy machinery until tomorrow (because of the sedation medicines used during the test).    FOLLOW UP: Our staff will call the number listed on your records 48-72 hours following your procedure to check on you and address any questions or concerns that you may have regarding the information given to you following your procedure. If we do not reach you, we will leave a message.  We will attempt to reach you two times.  During this call, we will ask if you have developed any symptoms of COVID 19. If you develop any symptoms (ie: fever, flu-like symptoms, shortness of breath, cough etc.) before then, please call [phone].  If you test positive for Covid 19 in the 2 weeks post procedure,  please call and report this information to [place].    If any biopsies were taken you will be contacted by phone or by letter within the next 1-3 weeks.  Please call us at [phone] if you have not heard about the biopsies in 3 weeks.    SIGNATURES/CONFIDENTIALITY: You and/or your care partner have signed paperwork which will be entered into your electronic medical record.  These signatures attest to the fact that that the information above on your After Visit Summary has been reviewed and is understood.  Full responsibility of the confidentiality of this discharge information lies with you and/or your care-partner.

## 2020-10-03 NOTE — Progress Notes (Signed)
PT taken to PACU. Monitors in place. VSS. Report given to RN. 

## 2020-10-03 NOTE — Op Note (Signed)
[facility] Patient Name: Ronald Alvarado Procedure Date: 10/03/2020 11:23 AM MRN: [id number] Endoscopist: Milus Banister , MD Age: 45 Referring MD:  Date of Birth: 08/04/1975 Gender: Male Account #: [id number] Procedure:                Colonoscopy Indications:              Screening for colorectal malignant neoplasm Medicines:                Monitored Anesthesia Care Procedure:                Pre-Anesthesia Assessment:                           - Prior to the procedure, a History and Physical                            was performed, and patient medications and                            allergies were reviewed. The patient's tolerance of                            previous anesthesia was also reviewed. The risks                            and benefits of the procedure and the sedation                            options and risks were discussed with the patient.                            All questions were answered, and informed consent                            was obtained. Prior Anticoagulants: The patient has                            taken no previous anticoagulant or antiplatelet                            agents. ASA Grade Assessment: II - A patient with                            mild systemic disease. After reviewing the risks                            and benefits, the patient was deemed in                            satisfactory condition to undergo the procedure.                           After obtaining informed consent, the colonoscope  was passed under direct vision. Throughout the                            procedure, the patient's blood pressure, pulse, and                            oxygen saturations were monitored continuously. The                            Olympus CF-HQ190L [id number]) Colonoscope was                            introduced through the anus and advanced to the the                            cecum, identified by  appendiceal orifice and                            ileocecal valve. The colonoscopy was performed                            without difficulty. The patient tolerated the                            procedure well. The quality of the bowel                            preparation was good. The ileocecal valve,                            appendiceal orifice, and rectum were photographed. Scope In: 11:41:46 AM Scope Out: 11:56:45 AM Scope Withdrawal Time: 0 hours 12 minutes 4 seconds  Total Procedure Duration: 0 hours 14 minutes 59 seconds  Findings:                 Three sessile polyps were found in the sigmoid                            colon and descending colon. The polyps were 3 to 4                            mm in size. These polyps were removed with a cold                            snare. Resection and retrieval were complete.                           A 15 mm polyp was found in the sigmoid colon. The                            polyp was pedunculated. The polyp was removed with  a hot snare. Resection and retrieval were complete.                           Internal hemorrhoids were found. The hemorrhoids                            were small.                           The exam was otherwise without abnormality on                            direct and retroflexion views. Complications:            No immediate complications. Estimated blood loss:                            None. Estimated Blood Loss:     Estimated blood loss: none. Impression:               - Three 3 to 4 mm polyps in the sigmoid colon and                            in the descending colon, removed with a cold snare.                            Resected and retrieved.                           - One 15 mm polyp in the sigmoid colon, removed                            with a hot snare. Resected and retrieved.                           - Internal hemorrhoids.                           - The  examination was otherwise normal on direct                            and retroflexion views. Recommendation:           - Patient has a contact number available for                            emergencies. The signs and symptoms of potential                            delayed complications were discussed with the                            patient. Return to normal activities tomorrow.                            Written discharge instructions were provided to the  patient.                           - Resume previous diet.                           - Continue present medications.                           - Await pathology results. Milus Banister, MD 10/03/2020 12:00:40 PM This report has been signed electronically.

## 2020-10-03 NOTE — Progress Notes (Signed)
Pt's states no medical or surgical changes since previsit or office visit.   VS taken by CW 

## 2020-10-03 NOTE — Progress Notes (Signed)
Called to room to assist during endoscopic procedure.  Patient ID and intended procedure confirmed with present staff. Received instructions for my participation in the procedure from the performing physician.  

## 2020-10-05 ENCOUNTER — Telehealth: Payer: Self-pay

## 2020-10-05 ENCOUNTER — Telehealth: Payer: Self-pay | Admitting: *Deleted

## 2020-10-05 NOTE — Telephone Encounter (Signed)
Left message on follow up call. 

## 2020-10-05 NOTE — Telephone Encounter (Signed)
  Follow up Call-  Call back number 10/03/2020  Post procedure Call Back phone  # [phone]  Permission to leave phone message Yes  Some recent data might be hidden     Patient questions:  Do you have a fever, pain , or abdominal swelling? No. Pain Score  0 *  Have you tolerated food without any problems? Yes.    Have you been able to return to your normal activities? Yes.    Do you have any questions about your discharge instructions: Diet   No. Medications  No. Follow up visit  No.  Do you have questions or concerns about your Care? No.  Actions: * If pain score is 4 or above: No action needed, pain <4.  Have you developed a fever since your procedure? no  2.   Have you had an respiratory symptoms (SOB or cough) since your procedure? no  3.   Have you tested positive for COVID 19 since your procedure no  4.   Have you had any family members/close contacts diagnosed with the COVID 19 since your procedure? no   If yes to any of these questions please route to Joylene John, RN and Joella Prince, RN

## 2020-10-11 ENCOUNTER — Encounter: Payer: Self-pay | Admitting: Gastroenterology

## 2020-11-27 ENCOUNTER — Other Ambulatory Visit (HOSPITAL_COMMUNITY): Payer: Self-pay

## 2021-09-16 ENCOUNTER — Other Ambulatory Visit: Payer: Self-pay | Admitting: Primary Care

## 2021-09-17 ENCOUNTER — Other Ambulatory Visit: Payer: Self-pay | Admitting: Family Medicine

## 2021-09-17 ENCOUNTER — Other Ambulatory Visit (HOSPITAL_COMMUNITY): Payer: Self-pay

## 2021-09-17 ENCOUNTER — Other Ambulatory Visit (HOSPITAL_BASED_OUTPATIENT_CLINIC_OR_DEPARTMENT_OTHER): Payer: Self-pay

## 2021-09-17 ENCOUNTER — Encounter (HOSPITAL_BASED_OUTPATIENT_CLINIC_OR_DEPARTMENT_OTHER): Payer: Self-pay

## 2021-09-17 DIAGNOSIS — G43701 Chronic migraine without aura, not intractable, with status migrainosus: Secondary | ICD-10-CM

## 2021-09-17 MED ORDER — SUMATRIPTAN SUCCINATE 100 MG PO TABS
ORAL_TABLET | ORAL | 0 refills | Status: DC
  Filled 2021-09-17: qty 9, 30d supply, fill #0

## 2021-09-17 NOTE — Telephone Encounter (Signed)
Patient called follow up made for 7/12. Would like to have refill called in to last until he is seen by kate.

## 2021-09-17 NOTE — Telephone Encounter (Signed)
Patient is overdue for follow up. Needs to be seen for refills.

## 2021-10-17 ENCOUNTER — Ambulatory Visit (INDEPENDENT_AMBULATORY_CARE_PROVIDER_SITE_OTHER): Payer: No Typology Code available for payment source | Admitting: Primary Care

## 2021-10-17 ENCOUNTER — Encounter: Payer: Self-pay | Admitting: Primary Care

## 2021-10-17 VITALS — BP 110/62 | HR 97 | Temp 98.6°F | Ht 69.5 in | Wt 184.0 lb

## 2021-10-17 DIAGNOSIS — G43701 Chronic migraine without aura, not intractable, with status migrainosus: Secondary | ICD-10-CM | POA: Diagnosis not present

## 2021-10-17 DIAGNOSIS — K219 Gastro-esophageal reflux disease without esophagitis: Secondary | ICD-10-CM

## 2021-10-17 DIAGNOSIS — F419 Anxiety disorder, unspecified: Secondary | ICD-10-CM

## 2021-10-17 DIAGNOSIS — M542 Cervicalgia: Secondary | ICD-10-CM

## 2021-10-17 DIAGNOSIS — Z0000 Encounter for general adult medical examination without abnormal findings: Secondary | ICD-10-CM

## 2021-10-17 DIAGNOSIS — G8929 Other chronic pain: Secondary | ICD-10-CM

## 2021-10-17 DIAGNOSIS — N529 Male erectile dysfunction, unspecified: Secondary | ICD-10-CM

## 2021-10-17 LAB — LIPID PANEL
Cholesterol: 238 mg/dL — ABNORMAL HIGH (ref 0–200)
HDL: 61.3 mg/dL (ref >39.00)
LDL Cholesterol: 153 mg/dL — ABNORMAL HIGH (ref 0–99)
NonHDL: 176.36
Total CHOL/HDL Ratio: 4
Triglycerides: 116 mg/dL (ref 0.0–149.0)
VLDL: 23.2 mg/dL (ref 0.0–40.0)

## 2021-10-17 LAB — CBC
HCT: 40.8 % (ref 39.0–52.0)
Hemoglobin: 13.5 g/dL (ref 13.0–17.0)
MCHC: 33.1 g/dL (ref 30.0–36.0)
MCV: 93.3 fl (ref 78.0–100.0)
Platelets: 203 10*3/uL (ref 150.0–400.0)
RBC: 4.37 Mil/uL (ref 4.22–5.81)
RDW: 13.5 % (ref 11.5–15.5)
WBC: 4.1 10*3/uL (ref 4.0–10.5)

## 2021-10-17 LAB — COMPREHENSIVE METABOLIC PANEL
ALT: 14 U/L (ref 0–53)
AST: 18 U/L (ref 0–37)
Albumin: 4.5 g/dL (ref 3.5–5.2)
Alkaline Phosphatase: 101 U/L (ref 39–117)
BUN: 12 mg/dL (ref 6–23)
CO2: 31 mEq/L (ref 19–32)
Calcium: 9.3 mg/dL (ref 8.4–10.5)
Chloride: 102 mEq/L (ref 96–112)
Creatinine, Ser: 1.16 mg/dL (ref 0.40–1.50)
GFR: 74.97 mL/min (ref >60.00)
Glucose, Bld: 89 mg/dL (ref 70–99)
Potassium: 3.8 mEq/L (ref 3.5–5.1)
Sodium: 140 mEq/L (ref 135–145)
Total Bilirubin: 0.4 mg/dL (ref 0.2–1.2)
Total Protein: 7.7 g/dL (ref 6.0–8.3)

## 2021-10-17 NOTE — Assessment & Plan Note (Signed)
Chronic, no concerns today.  Continue to monitor.

## 2021-10-17 NOTE — Assessment & Plan Note (Signed)
Controlled.  No concerns today. Continue sumatriptan 100 mg PRN.

## 2021-10-17 NOTE — Progress Notes (Signed)
Subjective:    Patient ID: Ronald Alvarado, male    DOB: 11-09-1975, 46 y.o.   MRN: [id number]  HPI  Ronald Alvarado is a very pleasant 46 y.o. male who presents today for complete physical and follow up of chronic conditions.  Immunizations: -Tetanus: 2019 -Influenza: Did not complete last season  -Covid-19: Has not completed   Diet: Fair diet.  Exercise: Three days weekly at the gym. Walking daily.  Eye exam: Completed 20 years ago.  Dental exam: Completes semi-annually   Colonoscopy: Completed in 2022, due 2025  BP Readings from Last 3 Encounters:  10/17/21 110/62  10/03/20 (!) 90/58  08/13/20 110/71         Review of Systems  Constitutional:  Negative for unexpected weight change.  HENT:  Negative for rhinorrhea.   Respiratory:  Negative for cough and shortness of breath.   Cardiovascular:  Negative for chest pain.  Gastrointestinal:  Negative for constipation and diarrhea.  Genitourinary:  Negative for difficulty urinating.  Musculoskeletal:  Positive for arthralgias and neck pain.  Skin:  Negative for rash.  Allergic/Immunologic: Negative for environmental allergies.  Neurological:  Negative for dizziness and headaches.  Psychiatric/Behavioral:  The patient is nervous/anxious.          Past Medical History:  Diagnosis Date   Anxiety    COVID-19 virus infection    08/13/2020   GERD (gastroesophageal reflux disease)    past hx with diet change   Migraines     Social History   Socioeconomic History   Marital status: Married    Spouse name: Not on file   Number of children: Not on file   Years of education: Not on file   Highest education level: Not on file  Occupational History   Not on file  Tobacco Use   Smoking status: Former    Types: Cigarettes    Quit date: 01/06/2009    Years since quitting: 12.7   Smokeless tobacco: Never  Vaping Use   Vaping Use: Never used  Substance and Sexual Activity   Alcohol use: Yes    Comment: infrequent    Drug use: Never   Sexual activity: Not on file  Other Topics Concern   Not on file  Social History Narrative   Married.   Adopted daughter.   Works as Curator.   Enjoys spending time with his daughter, golfing, baseball.   Social Determinants of Health   Financial Resource Strain: Not on file  Food Insecurity: Not on file  Transportation Needs: Not on file  Physical Activity: Not on file  Stress: Not on file  Social Connections: Not on file  Intimate Partner Violence: Not on file    Past Surgical History:  Procedure Laterality Date   dental implants     SIGMOIDOSCOPY  2008   hyperplastic polyps   WISDOM TOOTH EXTRACTION      Family History  Problem Relation Age of Onset   Diverticulitis Mother    Heart attack Paternal Grandmother    Heart attack Paternal Aunt    Colon cancer Neg Hx    Esophageal cancer Neg Hx    Rectal cancer Neg Hx    Stomach cancer Neg Hx     No Known Allergies  Current Outpatient Medications on File Prior to Visit  Medication Sig Dispense Refill   fluticasone (FLONASE) 50 MCG/ACT nasal spray Place 2 sprays into both nostrils daily. 16 g 6   Kava, Piper methysticum, (KAVA KAVA PO) Take  1 tablet by mouth.     Multiple Vitamin (MULTIVITAMIN) tablet Take 1 tablet by mouth daily.     OVER THE COUNTER MEDICATION Kratom supplement     SUMAtriptan (IMITREX) 100 MG tablet Take 1 tablet (100 mg total) by mouth at onset of headache may repeat in 2 hours,max 2 tabs per 24 hrs 9 tablet 0   tadalafil (CIALIS) 10 MG tablet Take 10 mg by mouth daily as needed.     No current facility-administered medications on file prior to visit.    BP 110/62   Pulse 97   Temp 98.6 F (37 C) (Oral)   Ht 5' 9.5" (1.765 m)   Wt 184 lb (83.5 kg)   SpO2 99%   BMI 26.78 kg/m  Objective:   Physical Exam HENT:     Right Ear: Tympanic membrane and ear canal normal.     Left Ear: Tympanic membrane and ear canal normal.     Nose: Nose normal.     Right Sinus:  No maxillary sinus tenderness or frontal sinus tenderness.     Left Sinus: No maxillary sinus tenderness or frontal sinus tenderness.  Eyes:     Conjunctiva/sclera: Conjunctivae normal.  Neck:     Thyroid: No thyromegaly.     Vascular: No carotid bruit.  Cardiovascular:     Rate and Rhythm: Normal rate and regular rhythm.     Heart sounds: Normal heart sounds.  Pulmonary:     Effort: Pulmonary effort is normal.     Breath sounds: Normal breath sounds. No wheezing or rales.  Abdominal:     General: Bowel sounds are normal.     Palpations: Abdomen is soft.     Tenderness: There is no abdominal tenderness.  Musculoskeletal:        General: Normal range of motion.     Cervical back: Neck supple.  Skin:    General: Skin is warm and dry.  Neurological:     Mental Status: He is alert and oriented to person, place, and time.     Cranial Nerves: No cranial nerve deficit.     Deep Tendon Reflexes: Reflexes are normal and symmetric.  Psychiatric:        Mood and Affect: Mood normal.           Assessment & Plan:   Problem List Items Addressed This Visit       Cardiovascular and Mediastinum   Chronic migraine without aura with status migrainosus, not intractable    Controlled.  No concerns today. Continue sumatriptan 100 mg PRN.       Relevant Medications   tadalafil (CIALIS) 10 MG tablet     Digestive   Gastroesophageal reflux disease    Controlled.  Infrequent symptoms. Continue to monitor.         Other   Preventative health care - Primary    Tetanus UTD.  Colonoscopy UTD, due 2025.  Commended him on regular exercise and a healthy diet.  Exam today stable, labs pending.       Relevant Orders   Lipid panel   Comprehensive metabolic panel   CBC   Chronic neck pain    Chronic, no concerns today.  Continue to monitor.       Erectile dysfunction    Following with an online MD. Prescribed Cialis 10 mg to use PRN.       Anxiety    Chronic,  overall manages well on his own.  Continue to monitor.  Pleas Koch, NP

## 2021-10-17 NOTE — Assessment & Plan Note (Signed)
Following with an online MD. Prescribed Cialis 10 mg to use PRN.

## 2021-10-17 NOTE — Assessment & Plan Note (Signed)
Chronic, overall manages well on his own.  Continue to monitor.

## 2021-10-17 NOTE — Assessment & Plan Note (Signed)
Tetanus UTD.  Colonoscopy UTD, due 2025.  Commended him on regular exercise and a healthy diet.  Exam today stable, labs pending.

## 2021-10-17 NOTE — Patient Instructions (Signed)
Stop by the lab prior to leaving today. I will notify you of your results once received.   It was a pleasure to see you today!  Preventive Care 40-48 Years Old, Male Preventive care refers to lifestyle choices and visits with your health care provider that can promote health and wellness. Preventive care visits are also called wellness exams. What can I expect for my preventive care visit? Counseling During your preventive care visit, your health care provider may ask about your: Medical history, including: Past medical problems. Family medical history. Current health, including: Emotional well-being. Home life and relationship well-being. Sexual activity. Lifestyle, including: Alcohol, nicotine or tobacco, and drug use. Access to firearms. Diet, exercise, and sleep habits. Safety issues such as seatbelt and bike helmet use. Sunscreen use. Work and work environment. Physical exam Your health care provider will check your: Height and weight. These may be used to calculate your BMI (body mass index). BMI is a measurement that tells if you are at a healthy weight. Waist circumference. This measures the distance around your waistline. This measurement also tells if you are at a healthy weight and may help predict your risk of certain diseases, such as type 2 diabetes and high blood pressure. Heart rate and blood pressure. Body temperature. Skin for abnormal spots. What immunizations do I need?  Vaccines are usually given at various ages, according to a schedule. Your health care provider will recommend vaccines for you based on your age, medical history, and lifestyle or other factors, such as travel or where you work. What tests do I need? Screening Your health care provider may recommend screening tests for certain conditions. This may include: Lipid and cholesterol levels. Diabetes screening. This is done by checking your blood sugar (glucose) after you have not eaten for a while  (fasting). Hepatitis B test. Hepatitis C test. HIV (human immunodeficiency virus) test. STI (sexually transmitted infection) testing, if you are at risk. Lung cancer screening. Prostate cancer screening. Colorectal cancer screening. Talk with your health care provider about your test results, treatment options, and if necessary, the need for more tests. Follow these instructions at home: Eating and drinking  Eat a diet that includes fresh fruits and vegetables, whole grains, lean protein, and low-fat dairy products. Take vitamin and mineral supplements as recommended by your health care provider. Do not drink alcohol if your health care provider tells you not to drink. If you drink alcohol: Limit how much you have to 0-2 drinks a day. Know how much alcohol is in your drink. In the [place] one drink equals one 12 oz bottle of beer (355 mL), one 5 oz glass of wine (148 mL), or one 1 oz glass of hard liquor (44 mL). Lifestyle Brush your teeth every morning and night with fluoride toothpaste. Floss one time each day. Exercise for at least 30 minutes 5 or more days each week. Do not use any products that contain nicotine or tobacco. These products include cigarettes, chewing tobacco, and vaping devices, such as e-cigarettes. If you need help quitting, ask your health care provider. Do not use drugs. If you are sexually active, practice safe sex. Use a condom or other form of protection to prevent STIs. Take aspirin only as told by your health care provider. Make sure that you understand how much to take and what form to take. Work with your health care provider to find out whether it is safe and beneficial for you to take aspirin daily. Find healthy ways to manage   stress, such as: Meditation, yoga, or listening to music. Journaling. Talking to a trusted person. Spending time with friends and family. Minimize exposure to UV radiation to reduce your risk of skin cancer. Safety Always wear  your seat belt while driving or riding in a vehicle. Do not drive: If you have been drinking alcohol. Do not ride with someone who has been drinking. When you are tired or distracted. While texting. If you have been using any mind-altering substances or drugs. Wear a helmet and other protective equipment during sports activities. If you have firearms in your house, make sure you follow all gun safety procedures. What's next? Go to your health care provider once a year for an annual wellness visit. Ask your health care provider how often you should have your eyes and teeth checked. Stay up to date on all vaccines. This information is not intended to replace advice given to you by your health care provider. Make sure you discuss any questions you have with your health care provider. Document Revised: 09/20/2020 Document Reviewed: 09/20/2020 Elsevier Patient Education  2023 [employer]  

## 2021-10-17 NOTE — Assessment & Plan Note (Signed)
Controlled.  Infrequent symptoms. Continue to monitor.

## 2021-10-18 DIAGNOSIS — E785 Hyperlipidemia, unspecified: Secondary | ICD-10-CM

## 2022-03-29 ENCOUNTER — Other Ambulatory Visit (HOSPITAL_COMMUNITY): Payer: Self-pay

## 2022-03-29 ENCOUNTER — Other Ambulatory Visit: Payer: Self-pay | Admitting: Primary Care

## 2022-03-29 MED ORDER — SUMATRIPTAN SUCCINATE 100 MG PO TABS
100.0000 mg | ORAL_TABLET | Freq: Every day | ORAL | 0 refills | Status: DC
  Filled 2022-03-29: qty 9, 30d supply, fill #0

## 2022-04-29 ENCOUNTER — Other Ambulatory Visit (HOSPITAL_COMMUNITY): Payer: Self-pay

## 2022-04-29 ENCOUNTER — Telehealth: Payer: 59 | Admitting: Physician Assistant

## 2022-04-29 DIAGNOSIS — J019 Acute sinusitis, unspecified: Secondary | ICD-10-CM

## 2022-04-29 DIAGNOSIS — B9689 Other specified bacterial agents as the cause of diseases classified elsewhere: Secondary | ICD-10-CM

## 2022-04-29 MED ORDER — AMOXICILLIN-POT CLAVULANATE 875-125 MG PO TABS
1.0000 | ORAL_TABLET | Freq: Two times a day (BID) | ORAL | 0 refills | Status: AC
  Filled 2022-04-29: qty 14, 7d supply, fill #0

## 2022-04-29 NOTE — Progress Notes (Signed)

## 2022-05-31 ENCOUNTER — Other Ambulatory Visit (HOSPITAL_COMMUNITY): Payer: Self-pay

## 2022-06-04 ENCOUNTER — Telehealth: Payer: Self-pay

## 2022-06-04 DIAGNOSIS — G43701 Chronic migraine without aura, not intractable, with status migrainosus: Secondary | ICD-10-CM

## 2022-06-04 NOTE — Telephone Encounter (Signed)
Received refill request for

## 2022-06-05 MED ORDER — SUMATRIPTAN SUCCINATE 100 MG PO TABS: 100.0000 mg | ORAL_TABLET | Freq: Every day | ORAL | 0 refills | Status: AC

## 2022-06-05 NOTE — Telephone Encounter (Signed)
Refills sent to pharmacy. 

## 2022-06-05 NOTE — Addendum Note (Signed)
Addended by: Pleas Koch on: 06/05/2022 02:58 PM   Modules accepted: Orders

## 2022-11-21 ENCOUNTER — Encounter (INDEPENDENT_AMBULATORY_CARE_PROVIDER_SITE_OTHER): Payer: Self-pay

## 2022-11-29 ENCOUNTER — Encounter: Payer: Self-pay | Admitting: Primary Care

## 2022-12-13 ENCOUNTER — Encounter: Payer: Self-pay | Admitting: Primary Care
